# Patient Record
Sex: Male | Born: 1965 | Race: White | Hispanic: No | Marital: Married | Smoking: Never smoker
Health system: Southern US, Community
[De-identification: ages and names within clinical notes are randomized; demographics above are authoritative.]

## PROBLEM LIST (undated history)

## (undated) DIAGNOSIS — K5792 Diverticulitis of intestine, part unspecified, without perforation or abscess without bleeding: Secondary | ICD-10-CM

---

## 2003-06-02 ENCOUNTER — Inpatient Hospital Stay (HOSPITAL_COMMUNITY): Admission: EM | Admit: 2003-06-02 | Discharge: 2003-06-03 | Payer: Self-pay | Admitting: Emergency Medicine

## 2004-08-06 HISTORY — PX: COLON SURGERY: SHX602

## 2013-11-09 ENCOUNTER — Emergency Department (HOSPITAL_COMMUNITY)
Admission: EM | Admit: 2013-11-09 | Discharge: 2013-11-09 | Disposition: A | Discharge status: Home / Self Care | Payer: BC Managed Care – PPO | Attending: Emergency Medicine | Admitting: Emergency Medicine

## 2013-11-09 ENCOUNTER — Encounter (HOSPITAL_COMMUNITY): Payer: Self-pay | Admitting: Emergency Medicine

## 2013-11-09 ENCOUNTER — Emergency Department (HOSPITAL_COMMUNITY): Payer: BC Managed Care – PPO

## 2013-11-09 DIAGNOSIS — Y9389 Activity, other specified: Secondary | ICD-10-CM | POA: Insufficient documentation

## 2013-11-09 DIAGNOSIS — Z8719 Personal history of other diseases of the digestive system: Secondary | ICD-10-CM | POA: Insufficient documentation

## 2013-11-09 DIAGNOSIS — S52613A Displaced fracture of unspecified ulna styloid process, initial encounter for closed fracture: Secondary | ICD-10-CM

## 2013-11-09 DIAGNOSIS — S52509A Unspecified fracture of the lower end of unspecified radius, initial encounter for closed fracture: Secondary | ICD-10-CM | POA: Insufficient documentation

## 2013-11-09 DIAGNOSIS — S52609A Unspecified fracture of lower end of unspecified ulna, initial encounter for closed fracture: Principal | ICD-10-CM

## 2013-11-09 DIAGNOSIS — S52502A Unspecified fracture of the lower end of left radius, initial encounter for closed fracture: Secondary | ICD-10-CM

## 2013-11-09 DIAGNOSIS — Y92410 Unspecified street and highway as the place of occurrence of the external cause: Secondary | ICD-10-CM | POA: Insufficient documentation

## 2013-11-09 HISTORY — DX: Diverticulitis of intestine, part unspecified, without perforation or abscess without bleeding: K57.92

## 2013-11-09 MED ORDER — IBUPROFEN 400 MG PO TABS
800.0000 mg | ORAL_TABLET | Freq: Once | ORAL | Status: AC
  Administered 2013-11-09: 800 mg via ORAL
  Filled 2013-11-09: qty 2

## 2013-11-09 MED ORDER — HYDROCODONE-ACETAMINOPHEN 5-325 MG PO TABS: 1.0000 | ORAL_TABLET | ORAL | Status: DC | PRN

## 2013-11-09 NOTE — Progress Notes (Signed)
Orthopedic Tech Progress Note Patient Details:  Consepcion HearingDavid Glance [birth year] [id number] Sugartong splint applied to Left UE. Arm sling applied. Application tolerated well.  Ortho Devices Type of Ortho Device: Ace wrap;Arm sling;Sugartong splint Ortho Device/Splint Location: Left  Ortho Device/Splint Interventions: Application   Asia R Thompson 11/09/2013, 10:25 AM

## 2013-11-09 NOTE — ED Provider Notes (Signed)
Medical screening examination/treatment/procedure(s) were performed by non-physician practitioner and as supervising physician I was immediately available for consultation/collaboration.   EKG Interpretation None        Idara Woodside M Tiara Maultsby, MD 11/09/13 1307 

## 2013-11-09 NOTE — ED Notes (Signed)
Unable to get ring off patient.  He tried soap and lubricant.

## 2013-11-09 NOTE — ED Notes (Signed)
Patient ring cut off by [place], EMT.   Patient tolerated well.

## 2013-11-09 NOTE — ED Notes (Signed)
Called ortho to come place splint.
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
## 2013-11-09 NOTE — Discharge Instructions (Signed)
Read the information below.  Use the prescribed medication as directed.  Please discuss all new medications with your pharmacist.  Do not take additional tylenol while taking the prescribed pain medication to avoid overdose.  You may return to the Emergency Department at any time for worsening condition or any new symptoms that concern you.  If you develop uncontrolled pain, weakness or numbness of the extremity, severe discoloration of the skin, or you are unable to move your fingers, return to the ER for a recheck.       Forearm Fracture Your caregiver has diagnosed you as having a broken bone (fracture) of the forearm. This is the part of your arm between the elbow and your wrist. Your forearm is made up of two bones. These are the radius and ulna. A fracture is a break in one or both bones. A cast or splint is used to protect and keep your injured bone from moving. The cast or splint will be on generally for about 5 to 6 weeks, with individual variations. HOME CARE INSTRUCTIONS   Keep the injured part elevated while sitting or lying down. Keeping the injury above the level of your heart (the center of the chest). This will decrease swelling and pain.  Apply ice to the injury for 15-20 minutes, 03-04 times per day while awake, for 2 days. Put the ice in a plastic bag and place a thin towel between the bag of ice and your cast or splint.  If you have a plaster or fiberglass cast:  Do not try to scratch the skin under the cast using sharp or pointed objects.  Check the skin around the cast every day. You may put lotion on any red or sore areas.  Keep your cast dry and clean.  If you have a plaster splint:  Wear the splint as directed.  You may loosen the elastic around the splint if your fingers become numb, tingle, or turn cold or blue.  Do not put pressure on any part of your cast or splint. It may break. Rest your cast only on a pillow the first 24 hours until it is fully  hardened.  Your cast or splint can be protected during bathing with a plastic bag. Do not lower the cast or splint into water.  Only take over-the-counter or prescription medicines for pain, discomfort, or fever as directed by your caregiver. SEEK IMMEDIATE MEDICAL CARE IF:   Your cast gets damaged or breaks.  You have more severe pain or swelling than you did before the cast.  Your skin or nails below the injury turn blue or gray, or feel cold or numb.  There is a bad smell or new stains and/or pus like (purulent) drainage coming from under the cast. MAKE SURE YOU:   Understand these instructions.  Will watch your condition.  Will get help right away if you are not doing well or get worse. Document Released: 07/20/2000 Document Revised: 10/15/2011 Document Reviewed: 03/11/2008 [facility] Patient Information 2014 [place], [place].  Cast or Splint Care Casts and splints support injured limbs and keep bones from moving while they heal.  HOME CARE  Keep the cast or splint uncovered during the drying period.  A plaster cast can take 24 to 48 hours to dry.  A fiberglass cast will dry in less than 1 hour.  Do not rest the cast on anything harder than a pillow for 24 hours.  Do not put weight on your injured limb. Do not put  pressure on the cast. Wait for your doctor's approval.  Keep the cast or splint dry.  Cover the cast or splint with a plastic bag during baths or wet weather.  If you have a cast over your chest and belly (trunk), take sponge baths until the cast is taken off.  If your cast gets wet, dry it with a towel or blow dryer. Use the cool setting on the blow dryer.  Keep your cast or splint clean. Wash a dirty cast with a damp cloth.  Do not put any objects under your cast or splint.  Do not scratch the skin under the cast with an object. If itching is a problem, use a blow dryer on a cool setting over the itchy area.  Do not trim or cut your cast.  Do not  take out the padding from inside your cast.  Exercise your joints near the cast as told by your doctor.  Raise (elevate) your injured limb on 1 or 2 pillows for the first 1 to 3 days. GET HELP IF:  Your cast or splint cracks.  Your cast or splint is too tight or too loose.  You itch badly under the cast.  Your cast gets wet or has a soft spot.  You have a bad smell coming from the cast.  You get an object stuck under the cast.  Your skin around the cast becomes red or sore.  You have new or more pain after the cast is put on. GET HELP RIGHT AWAY IF:  You have fluid leaking through the cast.  You cannot move your fingers or toes.  Your fingers or toes turn blue or white or are cool, painful, or puffy (swollen).  You have tingling or lose feeling (numbness) around the injured area.  You have bad pain or pressure under the cast.  You have trouble breathing or have shortness of breath.  You have chest pain. Document Released: 11/22/2010 Document Revised: 03/25/2013 Document Reviewed: 01/29/2013 [facility] Patient Information 2014 [place], [place].

## 2013-11-09 NOTE — ED Notes (Signed)
Patient has strong smell of alcohol.
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
## 2015-03-21 ENCOUNTER — Ambulatory Visit (INDEPENDENT_AMBULATORY_CARE_PROVIDER_SITE_OTHER): Payer: BLUE CROSS/BLUE SHIELD | Admitting: Primary Care

## 2015-03-21 ENCOUNTER — Encounter: Payer: Self-pay | Admitting: Primary Care

## 2015-03-21 VITALS — BP 142/82 | HR 76 | Temp 98.2°F | Ht 73.0 in | Wt 218.1 lb

## 2015-03-21 DIAGNOSIS — R030 Elevated blood-pressure reading, without diagnosis of hypertension: Secondary | ICD-10-CM

## 2015-03-21 DIAGNOSIS — R7309 Other abnormal glucose: Secondary | ICD-10-CM | POA: Diagnosis not present

## 2015-03-21 DIAGNOSIS — R7303 Prediabetes: Secondary | ICD-10-CM

## 2015-03-21 DIAGNOSIS — G4700 Insomnia, unspecified: Secondary | ICD-10-CM | POA: Diagnosis not present

## 2015-03-21 DIAGNOSIS — E119 Type 2 diabetes mellitus without complications: Secondary | ICD-10-CM | POA: Insufficient documentation

## 2015-03-21 HISTORY — DX: Elevated blood-pressure reading, without diagnosis of hypertension: R03.0

## 2015-03-21 LAB — HEMOGLOBIN A1C: Hgb A1c MFr Bld: 6.4 % (ref 4.6–6.5)

## 2015-03-21 MED ORDER — TRAZODONE HCL 50 MG PO TABS: 25.0000 mg | ORAL_TABLET | Freq: Every evening | ORAL | Status: DC | PRN

## 2015-03-21 NOTE — Assessment & Plan Note (Signed)
Present for several years. Difficulty falling asleep and will wake up every night. Denies snoring, gasping for air in the night. RX for trazodone 25-50 mg at bedtime. Will continue to monitor.

## 2015-03-21 NOTE — Assessment & Plan Note (Addendum)
Denies prior elevated reading. Slightly elevated today, will continue to monitor.  Denies symptoms of headaches, chest pain, SOB.

## 2015-03-21 NOTE — Patient Instructions (Signed)
Start Trazodone tablet for difficulty sleeping. You may take 1/2 to 1 tablet by mouth 1 hour prior to bedtime for sleep.  Complete lab work prior to leaving today. I will notify you of your results.  Please schedule a physical with me in the next 3 months. You will also schedule a lab only appointment one week prior. We will discuss your lab results during your physical.  It was a pleasure to meet you today! Please don't hesitate to call me with any questions. Welcome to [facility]!

## 2015-03-21 NOTE — Assessment & Plan Note (Signed)
Endorses history of several years ago, no recent A1C. Will check today. Denies numbness/tingling. Overall poor diet.

## 2015-03-21 NOTE — Progress Notes (Signed)
Subjective:    Patient ID: Cody Oneill, male    DOB: 01-21-1966, 50 y.o.   MRN: [id number]  HPI  Cody Oneill is a 49 year old male who presents today to establish care and discuss the problems mentioned below. Will obtain old records.  1) Elevated blood pressure reading: Denies ever having elevated readings. Slightly elevated in the office today. Denies headaches, chest pain.  2) Borderline Diabetes: He was told that he was borderline diabetic during his last check up at [facility] several years ago. He endorses a fair diet which consists of:  Breakfast: Skips, fried egg, bacon, toast, orange juice, milk, coffee Lunch: Fast food (sandwich, hamburger) Dinner: Salad, steak, hamburgers, chicken, pork, vegetables. Desserts: Occasionally Beverages: Drinks mostly water, soda ( less than 2 per week)  Exercise: Does not currently exercise. Sometimes plays tennis.  3) Insomnia: Difficulty falling asleep every night. Will take 1-2 hours for him to fall asleep and will wake up in the middle of the night every night. He occasionally feels tired throughout the day. He will take Tylenol PM and will feel groggy throughout the following day. He's also tried Melatonin without help. Denies snoring, waking up gasping for air.   Review of Systems  Constitutional: Negative for unexpected weight change.  HENT: Negative for rhinorrhea.   Respiratory: Negative for cough and shortness of breath.   Cardiovascular: Negative for chest pain.  Gastrointestinal: Negative for diarrhea and constipation.  Genitourinary: Negative for difficulty urinating.  Musculoskeletal: Negative for myalgias and arthralgias.  Skin: Negative for rash.  Neurological: Negative for dizziness, numbness and headaches.  Psychiatric/Behavioral:       Some anxiety, able to manage himself.       Past Medical History  Diagnosis Date  . Diverticulitis     Social History   Social History  . Marital Status: Married    Spouse Name:  N/A  . Number of Children: N/A  . Years of Education: N/A   Occupational History  . Not on file.   Social History Main Topics  . Smoking status: Never Smoker   . Smokeless tobacco: Not on file  . Alcohol Use: 0.0 oz/week    0 Cans of beer per week  . Drug Use: No  . Sexual Activity: Not on file   Other Topics Concern  . Not on file   Social History Narrative   Married.   3 children.   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   Enjoys playing tennis, going to the lake, snow skiing.     Past Surgical History  Procedure Laterality Date  . Colon surgery  2006    History reviewed. No pertinent family history.  Allergies  Allergen Reactions  . Wasp Venom     No current outpatient prescriptions on file prior to visit.   No current facility-administered medications on file prior to visit.    BP 142/82 mmHg  Pulse 76  Temp(Src) 98.2 F (36.8 C) (Oral)  Ht  (1.854 m)  Wt 218 lb 1.9 oz (98.939 kg)  BMI 28.78 kg/m2  SpO2 97%    Objective:   Physical Exam  Constitutional: He appears well-nourished.  HENT:  Head: Normocephalic.  Cardiovascular: Normal rate and regular rhythm.   Pulmonary/Chest: Effort normal and breath sounds normal.  Abdominal: Soft. Bowel sounds are normal.  Neurological: He is alert.  Skin: Skin is warm and dry.  Psychiatric: He has a normal mood and affect.  Assessment & Plan:

## 2015-03-21 NOTE — Progress Notes (Signed)
Pre visit review using our clinic review tool, if applicable. No additional management support is needed unless otherwise documented below in the visit note. 

## 2015-03-23 ENCOUNTER — Encounter: Payer: Self-pay | Admitting: *Deleted

## 2015-04-04 ENCOUNTER — Encounter: Payer: Self-pay | Admitting: Family Medicine

## 2015-04-04 ENCOUNTER — Ambulatory Visit (INDEPENDENT_AMBULATORY_CARE_PROVIDER_SITE_OTHER): Payer: BLUE CROSS/BLUE SHIELD | Admitting: Family Medicine

## 2015-04-04 ENCOUNTER — Other Ambulatory Visit: Payer: Self-pay | Admitting: Primary Care

## 2015-04-04 VITALS — BP 110/70 | HR 78 | Temp 98.7°F | Wt 221.0 lb

## 2015-04-04 DIAGNOSIS — Z23 Encounter for immunization: Secondary | ICD-10-CM

## 2015-04-04 DIAGNOSIS — S61411A Laceration without foreign body of right hand, initial encounter: Secondary | ICD-10-CM

## 2015-04-04 DIAGNOSIS — Z0000 Encounter for general adult medical examination without abnormal findings: Secondary | ICD-10-CM

## 2015-04-04 NOTE — Patient Instructions (Signed)
If any spreading redness or pus draining, then update [place].  Keep it clean and covered in the meantime.  Trim the end of the steristrips at needed.  Take care.  Glad to see you.  Call back as needed.   Keep your schedule appointment.

## 2015-04-04 NOTE — Progress Notes (Signed)
Pre visit review using our clinic review tool, if applicable. No additional management support is needed unless otherwise documented below in the visit note.  Cut on R hand yesterday.  Was working on a tie down strap with a wench, it was under tension and broke and slapped across his hand.  This was >12 hours before the call into the clinic this AM.  He cleaned the area with soapy water and bandaged it.  For eval today.   Meds, vitals, and allergies reviewed.   ROS: See HPI.  Otherwise, noncontributory.  nad R hand with 3cm R thenar area superficial lac with some puffy subQ material protruding, but this is minimal.   Appears clean now o/w.  Not bleeding.   No deep lac noted on thorough exam.  No FB noted.  Wound soaked in saline/betadine.  Then irritated with saline.   Distally NV intact, normal hand/finger/wrist ROM.

## 2015-04-05 DIAGNOSIS — S61419A Laceration without foreign body of unspecified hand, initial encounter: Secondary | ICD-10-CM | POA: Insufficient documentation

## 2015-04-05 NOTE — Assessment & Plan Note (Signed)
We shouldn't suture given the duration and the exam.  Cleaned, no FB, doesn't appear to need proph abx by guidelines.  Tdap updated.  Tissue approximated with steristrips.  Tolerated well.  Covered with gauze and coban.  Recheck with PCP in a few days.  Cautions given re: signs of infection.  Okay for outpatient f/u.   >25 minutes spent in face to face time with patient with wound eval/treatment.

## 2015-04-07 ENCOUNTER — Other Ambulatory Visit (INDEPENDENT_AMBULATORY_CARE_PROVIDER_SITE_OTHER): Payer: BLUE CROSS/BLUE SHIELD

## 2015-04-07 DIAGNOSIS — Z0000 Encounter for general adult medical examination without abnormal findings: Secondary | ICD-10-CM

## 2015-04-07 DIAGNOSIS — R7989 Other specified abnormal findings of blood chemistry: Secondary | ICD-10-CM | POA: Diagnosis not present

## 2015-04-07 LAB — CBC
HCT: 48.4 % (ref 39.0–52.0)
HEMOGLOBIN: 16.3 g/dL (ref 13.0–17.0)
MCHC: 33.7 g/dL (ref 30.0–36.0)
MCV: 91.9 fl (ref 78.0–100.0)
PLATELETS: 376 10*3/uL (ref 150.0–400.0)
RBC: 5.26 Mil/uL (ref 4.22–5.81)
RDW: 13.5 % (ref 11.5–15.5)
WBC: 6.9 10*3/uL (ref 4.0–10.5)

## 2015-04-07 LAB — COMPREHENSIVE METABOLIC PANEL
ALK PHOS: 66 U/L (ref 39–117)
ALT: 47 U/L (ref 0–53)
AST: 27 U/L (ref 0–37)
Albumin: 4.1 g/dL (ref 3.5–5.2)
BILIRUBIN TOTAL: 0.3 mg/dL (ref 0.2–1.2)
BUN: 11 mg/dL (ref 6–23)
CO2: 25 meq/L (ref 19–32)
Calcium: 9.3 mg/dL (ref 8.4–10.5)
Chloride: 103 mEq/L (ref 96–112)
Creatinine, Ser: 1.07 mg/dL (ref 0.40–1.50)
GFR: 77.9 mL/min (ref >60.00)
Glucose, Bld: 137 mg/dL — ABNORMAL HIGH (ref 70–99)
Potassium: 4.7 mEq/L (ref 3.5–5.1)
SODIUM: 137 meq/L (ref 135–145)
TOTAL PROTEIN: 6.8 g/dL (ref 6.0–8.3)

## 2015-04-07 LAB — LIPID PANEL
CHOL/HDL RATIO: 5
Cholesterol: 205 mg/dL — ABNORMAL HIGH (ref 0–200)
HDL: 41.5 mg/dL (ref >39.00)
NONHDL: 163.23
TRIGLYCERIDES: 296 mg/dL — AB (ref 0.0–149.0)
VLDL: 59.2 mg/dL — ABNORMAL HIGH (ref 0.0–40.0)

## 2015-04-07 LAB — LDL CHOLESTEROL, DIRECT: Direct LDL: 124 mg/dL

## 2015-04-13 ENCOUNTER — Encounter: Payer: BLUE CROSS/BLUE SHIELD | Admitting: Primary Care

## 2015-04-28 ENCOUNTER — Encounter: Payer: Self-pay | Admitting: Primary Care

## 2015-04-28 ENCOUNTER — Ambulatory Visit (INDEPENDENT_AMBULATORY_CARE_PROVIDER_SITE_OTHER): Payer: BLUE CROSS/BLUE SHIELD | Admitting: Primary Care

## 2015-04-28 VITALS — BP 138/82 | HR 83 | Temp 98.5°F | Ht 73.0 in | Wt 223.1 lb

## 2015-04-28 DIAGNOSIS — Z23 Encounter for immunization: Secondary | ICD-10-CM

## 2015-04-28 DIAGNOSIS — R7309 Other abnormal glucose: Secondary | ICD-10-CM

## 2015-04-28 DIAGNOSIS — Z0000 Encounter for general adult medical examination without abnormal findings: Secondary | ICD-10-CM | POA: Diagnosis not present

## 2015-04-28 DIAGNOSIS — S61411A Laceration without foreign body of right hand, initial encounter: Secondary | ICD-10-CM

## 2015-04-28 DIAGNOSIS — R030 Elevated blood-pressure reading, without diagnosis of hypertension: Secondary | ICD-10-CM | POA: Diagnosis not present

## 2015-04-28 DIAGNOSIS — R7303 Prediabetes: Secondary | ICD-10-CM

## 2015-04-28 DIAGNOSIS — E7800 Pure hypercholesterolemia, unspecified: Secondary | ICD-10-CM | POA: Insufficient documentation

## 2015-04-28 DIAGNOSIS — G4700 Insomnia, unspecified: Secondary | ICD-10-CM

## 2015-04-28 MED ORDER — TRAZODONE HCL 100 MG PO TABS: 100.0000 mg | ORAL_TABLET | Freq: Every day | ORAL | Status: DC

## 2015-04-28 NOTE — Assessment & Plan Note (Signed)
No overall improvement with 50 mg tablets, will increase to 100 mg. Follow up in November.

## 2015-04-28 NOTE — Assessment & Plan Note (Signed)
Tetanus UTD, will provide flu today. Discussed the importance of healthy diet and regular exercise. Recommended annual eye exams. Exam unremarkable. Trigs and cholesterol elevated, discussed treatment. Follow up in 1 year for repeat physical.

## 2015-04-28 NOTE — Assessment & Plan Note (Signed)
TC, Trigs, and VLDL above goal. Discussed the importance of healthy diet and exercise. Start Fish Oil 2000 mg daily. Will recheck in 6 months.

## 2015-04-28 NOTE — Progress Notes (Signed)
Subjective:    Patient ID: Cody Oneill, male    DOB: November 19, 1965, 49 y.o.   MRN: [id number]  HPI  Cody Oneill is a 49 year old male who presents today for complete physical.  Immunizations: -Tetanus: Completed in August 2016 -Influenza: Has not completed this season, due today.   Diet: Endorses a fair diet. Breakfast: Skips, sometimes toast and coffee Lunch: Sandwiches ([place] subs, and home made sandwiches), soup. Dinner: Home cooked meals with lean meats, vegetables. Desserts: Rare Snacks: None Beverages: Dr. Reino Kent ( 2 12 ounce cans), water ( 3-10 glasses per day), beer on the weekends. Exercise: He purchased a fit bit and joined the country club. He's playing tennis and is walking. He is exercising daily for 30 minutes. Eye exam: Completed years ago. No changes in vision. Dental exam: Completes semi-annually    Review of Systems  Constitutional: Negative for unexpected weight change.  HENT: Negative for rhinorrhea.   Respiratory: Negative for cough and shortness of breath.   Cardiovascular: Negative for chest pain.  Gastrointestinal: Negative for diarrhea and constipation.  Genitourinary: Negative for difficulty urinating.  Musculoskeletal: Negative for myalgias and arthralgias.  Skin: Negative for rash.  Allergic/Immunologic: Negative for environmental allergies.  Neurological: Negative for dizziness, numbness and headaches.  Psychiatric/Behavioral:       Denies concerns for anxiety or depression.       Past Medical History  Diagnosis Date  . Diverticulitis     Social History   Social History  . Marital Status: Married    Spouse Name: N/A  . Number of Children: N/A  . Years of Education: N/A   Occupational History  . Not on file.   Social History Main Topics  . Smoking status: Never Smoker   . Smokeless tobacco: Not on file  . Alcohol Use: 0.0 oz/week    0 Cans of beer per week     Comment: weekends  . Drug Use: No  . Sexual Activity: Not on  file   Other Topics Concern  . Not on file   Social History Narrative   Married.   3 children.   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   Enjoys playing tennis, going to the lake, snow skiing.     Past Surgical History  Procedure Laterality Date  . Colon surgery  2006    No family history on file.  Allergies  Allergen Reactions  . Wasp Venom     No current outpatient prescriptions on file prior to visit.   No current facility-administered medications on file prior to visit.    BP 138/82 mmHg  Pulse 83  Temp(Src) 98.5 F (36.9 C) (Oral)  Ht  (1.854 m)  Wt 223 lb 1.9 oz (101.207 kg)  BMI 29.44 kg/m2  SpO2 97%    Objective:   Physical Exam  Constitutional: He is oriented to person, place, and time. He appears well-nourished.  HENT:  Right Ear: Tympanic membrane and ear canal normal.  Left Ear: Tympanic membrane and ear canal normal.  Nose: Nose normal.  Mouth/Throat: Oropharynx is clear and moist.  Eyes: Conjunctivae and EOM are normal. Pupils are equal, round, and reactive to light.  Neck: Neck supple.  Cardiovascular: Normal rate and regular rhythm.   Pulmonary/Chest: Effort normal and breath sounds normal.  Abdominal: Soft. Bowel sounds are normal. There is no tenderness.  Musculoskeletal: Normal range of motion.  Lymphadenopathy:    He has no cervical adenopathy.  Neurological: He is alert and oriented  to person, place, and time. No cranial nerve deficit.  Patellar DTR's decreased. History of chronic knee injuries.  Skin: Skin is warm and dry.  Psychiatric: He has a normal mood and affect.          Assessment & Plan:

## 2015-04-28 NOTE — Assessment & Plan Note (Signed)
Stable today.

## 2015-04-28 NOTE — Assessment & Plan Note (Signed)
Working to improve his diet and is aware of limitations. Discussed to decrease sodas, fast food, pastas, breads, rice. Increase lean meat and vegetables. Discussed the importance of daily exercise. Will recheck A1C in mid November.

## 2015-04-28 NOTE — Patient Instructions (Addendum)
It is important that you improve your diet. Please limit carbohydrates in the form of white bread, rice, pasta, cakes, cookies, sugary drinks, etc. Increase your consumption of fresh fruits and vegetables.   Increase water consumption. You need at least 2 liters daily.  You should be getting 1 hour of moderate intensity exercise 5 days weekly.  Start taking fish oil for elevated triglycerides. You need 2000 mg daily.  Follow up after November 15th for re-evaluation of borderline diabetes.   It was a pleasure to see you today!

## 2015-04-28 NOTE — Assessment & Plan Note (Signed)
Much improved, healing well.  Edges approximated and scabbing present to anterior surface. No s/s of infection.

## 2015-04-28 NOTE — Addendum Note (Signed)
Addended by: Tawnya Crook on: 04/28/2015 10:30 AM   Modules accepted: Orders

## 2015-04-28 NOTE — Progress Notes (Signed)
Pre visit review using our clinic review tool, if applicable. No additional management support is needed unless otherwise documented below in the visit note. 

## 2015-06-13 ENCOUNTER — Ambulatory Visit (INDEPENDENT_AMBULATORY_CARE_PROVIDER_SITE_OTHER): Payer: BLUE CROSS/BLUE SHIELD | Admitting: Primary Care

## 2015-06-13 ENCOUNTER — Other Ambulatory Visit: Payer: Self-pay | Admitting: Primary Care

## 2015-06-13 ENCOUNTER — Encounter: Payer: Self-pay | Admitting: Primary Care

## 2015-06-13 VITALS — BP 144/86 | HR 83 | Temp 96.8°F | Ht 73.0 in | Wt 217.8 lb

## 2015-06-13 DIAGNOSIS — R030 Elevated blood-pressure reading, without diagnosis of hypertension: Secondary | ICD-10-CM

## 2015-06-13 DIAGNOSIS — G4700 Insomnia, unspecified: Secondary | ICD-10-CM

## 2015-06-13 DIAGNOSIS — R7303 Prediabetes: Secondary | ICD-10-CM | POA: Diagnosis not present

## 2015-06-13 DIAGNOSIS — R591 Generalized enlarged lymph nodes: Secondary | ICD-10-CM | POA: Diagnosis not present

## 2015-06-13 DIAGNOSIS — Z113 Encounter for screening for infections with a predominantly sexual mode of transmission: Secondary | ICD-10-CM | POA: Diagnosis not present

## 2015-06-13 DIAGNOSIS — R599 Enlarged lymph nodes, unspecified: Secondary | ICD-10-CM

## 2015-06-13 LAB — POCT RAPID STREP A (OFFICE): Rapid Strep A Screen: NEGATIVE

## 2015-06-13 MED ORDER — AMOXICILLIN-POT CLAVULANATE 875-125 MG PO TABS: 1.0000 | ORAL_TABLET | Freq: Two times a day (BID) | ORAL | Status: DC

## 2015-06-13 MED ORDER — ZOLPIDEM TARTRATE 5 MG PO TABS: 5.0000 mg | ORAL_TABLET | Freq: Every evening | ORAL | Status: DC | PRN

## 2015-06-13 NOTE — Patient Instructions (Addendum)
Start Ambien for insomnia. Take 1 tablet by mouth 1 hour prior to bedtime.  Start Augmentin antibiotics for swollen glands. Take 1 tablet by mouth twice daily for 10 days.  Complete lab work prior to leaving today. I will notify you of your results.  Schedule a lab only appointment after November 15th for diabetes check.  Follow up in 6 weeks for re-evaluation of insomnia.  It was a pleasure to see you today!

## 2015-06-13 NOTE — Assessment & Plan Note (Signed)
Elevated today; however, acutely ill. Stable at plasma donation center. Will continue to monitor.

## 2015-06-13 NOTE — Progress Notes (Signed)
Subjective:    Patient ID: [name], male    DOB: March 30, 1966, 49 y.o.   MRN: [id number]  HPI  [name] is a 49 year old male who presents today with multiple complaints.  1) Adenopathy: Located to bilateral mandibular region, left sided larger than right. He first noticed his swelling on Saturday this past weekend. He also reports symptoms of chills, sore throat, joint aches. He's taken Aspirin this morning without relief. Denies cough, nasal congestion. His adenopathy became worse last night.   2) Insomnia: Currently managed on Trazodone 100 mg that was increased in late September 2016. Since his last visit he's not noticed any difference or improvement in sleep. He has difficulty falling asleep but typically will stay asleep. He's not slept in 2 days. Denies snoring.   3) Elevated Blood Pressure Reading: Elevated in August 2016 and again today in the clinic. He's had his BP checked at a plasma station which has been 130/70's.   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 He denies sores, penile discharge, penile itching, and is in a monogamous relationship.  Review of Systems  Constitutional: Positive for chills. Negative for fever.  HENT: Positive for sore throat. Negative for congestion, ear pain and sinus pressure.   Respiratory: Negative for cough and shortness of breath.   Cardiovascular: Negative for chest pain.  Genitourinary: Negative for dysuria, discharge, penile swelling, scrotal swelling, penile pain and testicular pain.  Musculoskeletal: Positive for myalgias.  Neurological: Negative for headaches.  Hematological: Positive for adenopathy.       Past Medical History  Diagnosis Date  . Diverticulitis     Social History   Social History  . Marital Status: Married    Spouse Name: N/A  . Number of Children: N/A  . Years of Education: N/A   Occupational History  . Not on file.   Social History Main Topics  . Smoking  status: Never Smoker   . Smokeless tobacco: Not on file  . Alcohol Use: 0.0 oz/week    0 Cans of beer per week     Comment: weekends  . Drug Use: No  . Sexual Activity: Not on file   Other Topics Concern  . Not on file   Social History Narrative   Married.   3 children.   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   Enjoys playing tennis, going to the lake, snow skiing.     Past Surgical History  Procedure Laterality Date  . Colon surgery  2006    No family history on file.  Allergies  Allergen Reactions  . Wasp Venom     No current outpatient prescriptions on file prior to visit.   No current facility-administered medications on file prior to visit.    BP 144/86 mmHg  Pulse 83  Temp(Src) 96.8 F (36 C) (Oral)  Ht 6\' 1"  (1.854 m)  Wt 217 lb 12.8 oz (98.793 kg)  BMI 28.74 kg/m2  SpO2 98%    Objective:   Physical Exam  Constitutional: He appears well-nourished.  HENT:  Right Ear: Tympanic membrane and ear canal normal.  Left Ear: Tympanic membrane and ear canal normal.  Nose: Right sinus exhibits no maxillary sinus tenderness and no frontal sinus tenderness. Left sinus exhibits no maxillary sinus tenderness and no frontal sinus tenderness.  Mouth/Throat: Posterior oropharyngeal edema and posterior oropharyngeal erythema present. No oropharyngeal exudate.  Neck:  2.5 cm enlarged lymph noted to left mandibular region. 1 cm  enlarged lymph noted to right mandibular side.  Cardiovascular: Normal rate and regular rhythm.   Pulmonary/Chest: Effort normal and breath sounds normal.  Lymphadenopathy:    He has cervical adenopathy.  Skin: Skin is warm and dry.          Assessment & Plan:  Lymphadenopathy:  Located to bilateral mandibular region, left being larger than right. Tender. Immobile. Also with body aches, fatigue, sore throat, fevers. Rapid Strep: Negative. Due to symptoms, presentation will treat empirically with Augmentin 10 day course. He is to  follow up if no improvement once antibiotics are complete.  Syphillus:  Positive test at plasma donation center. Currently in monogamous relationship, no symptoms. Will recheck today and add on HIV and Hep C panel.

## 2015-06-13 NOTE — Assessment & Plan Note (Signed)
No improvement with increased dose of Trazodone. Will stop trazodone and switch to Ambien HS. He is to follow up in 6 weeks for re-evaluation.

## 2015-06-13 NOTE — Assessment & Plan Note (Signed)
Due for recheck in mid November, he is to schedule lab only appointment then.

## 2015-06-13 NOTE — Progress Notes (Signed)
Pre visit review using our clinic review tool, if applicable. No additional management support is needed unless otherwise documented below in the visit note. 

## 2015-06-14 ENCOUNTER — Encounter: Payer: Self-pay | Admitting: *Deleted

## 2015-06-14 ENCOUNTER — Telehealth: Payer: Self-pay | Admitting: Primary Care

## 2015-06-14 ENCOUNTER — Other Ambulatory Visit: Payer: Self-pay | Admitting: Primary Care

## 2015-06-14 ENCOUNTER — Encounter (INDEPENDENT_AMBULATORY_CARE_PROVIDER_SITE_OTHER): Payer: Self-pay

## 2015-06-14 DIAGNOSIS — R7303 Prediabetes: Secondary | ICD-10-CM

## 2015-06-14 LAB — HIV ANTIBODY (ROUTINE TESTING W REFLEX): HIV: NONREACTIVE

## 2015-06-14 LAB — HEPATITIS C ANTIBODY: HCV AB: NEGATIVE

## 2015-06-14 LAB — RPR

## 2015-06-14 NOTE — Telephone Encounter (Signed)
Called and spoken to patient. Patient wanted a copy of labs.Sending letter with results and Kate's comments for patient.

## 2015-06-14 NOTE — Telephone Encounter (Signed)
Pt calling regarding labs please call back at [phone] Thank you

## 2015-06-22 ENCOUNTER — Other Ambulatory Visit: Payer: BLUE CROSS/BLUE SHIELD

## 2015-06-23 ENCOUNTER — Other Ambulatory Visit: Payer: BLUE CROSS/BLUE SHIELD

## 2015-06-27 ENCOUNTER — Ambulatory Visit (INDEPENDENT_AMBULATORY_CARE_PROVIDER_SITE_OTHER): Payer: BLUE CROSS/BLUE SHIELD | Admitting: Primary Care

## 2015-06-27 ENCOUNTER — Telehealth: Payer: Self-pay | Admitting: Primary Care

## 2015-06-27 ENCOUNTER — Encounter: Payer: Self-pay | Admitting: Primary Care

## 2015-06-27 VITALS — BP 124/84 | HR 75 | Temp 97.7°F | Ht 73.0 in | Wt 215.8 lb

## 2015-06-27 DIAGNOSIS — E119 Type 2 diabetes mellitus without complications: Secondary | ICD-10-CM | POA: Diagnosis not present

## 2015-06-27 DIAGNOSIS — G4700 Insomnia, unspecified: Secondary | ICD-10-CM

## 2015-06-27 DIAGNOSIS — R7303 Prediabetes: Secondary | ICD-10-CM

## 2015-06-27 LAB — HEMOGLOBIN A1C: Hgb A1c MFr Bld: 6.5 % (ref 4.6–6.5)

## 2015-06-27 MED ORDER — METFORMIN HCL ER 500 MG PO TB24: 500.0000 mg | ORAL_TABLET | Freq: Every day | ORAL | Status: DC

## 2015-06-27 NOTE — Telephone Encounter (Signed)
Called and notified patient of Kate's comments. Patient verbalized understanding.  

## 2015-06-27 NOTE — Assessment & Plan Note (Signed)
Improved with initiation of Ambien. Now getting 6 hours of sleep on average. Overall feeling much better.

## 2015-06-27 NOTE — Patient Instructions (Addendum)
Complete lab work prior to leaving today. I will notify you of your results.  Schedule a lab only appointment in March 2017 for cholesterol check.  Cancel your upcoming appointment. I'll see you in September 2017 for repeat physical or sooner if needed.  It was a pleasure to see you today!

## 2015-06-27 NOTE — Progress Notes (Signed)
   Subjective:    Patient ID: [name], male    DOB: 07/31/1966, 49 y.o.   MRN: [id number]  HPI  Cody Oneill is a 49 year old male who presents today for follow up.  1) Borderline Diabetes:  A1C of 6.4 in August 2016. Due for repeat A1C today. He's been working to improve his diet.  Breakfast: Skips, toast with peanut butter, milk, coffee, occasionally eggs. Lunch: Soup and salad, fast food Dinner: Home cooked meals, grilled and baked lean meats, vegetables, fruit Desserts: Occasionally Snacks: None Beverages: Dr. Reino KentPepper (one daily), water (3 large glasses daily).  2) Insomnia: Initiated on Ambien in early November for insomnia as trazodone had no effect. Since initiation of Ambien he's experiencing improvement in sleep and is now getting 6 hours of sleep a night. He's not waking up in the middle of the night. Denies nightmares, sleep walking.   Review of Systems  Respiratory: Negative for shortness of breath.   Cardiovascular: Negative for chest pain.  Neurological: Negative for dizziness, numbness and headaches.  Psychiatric/Behavioral: Negative for suicidal ideas and sleep disturbance.       Past Medical History  Diagnosis Date  . Diverticulitis     Social History   Social History  . Marital Status: Married    Spouse Name: N/A  . Number of Children: N/A  . Years of Education: N/A   Occupational History  . Not on file.   Social History Main Topics  . Smoking status: Never Smoker   . Smokeless tobacco: Not on file  . Alcohol Use: 0.0 oz/week    0 Cans of beer per week     Comment: weekends  . Drug Use: No  . Sexual Activity: Not on file   Other Topics Concern  . Not on file   Social History Narrative   Married.   3 children.   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   Enjoys playing tennis, going to the lake, snow skiing.     Past Surgical History  Procedure Laterality Date  . Colon surgery  2006    No family history on file.  Allergies    Allergen Reactions  . Wasp Venom     Current Outpatient Prescriptions on File Prior to Visit  Medication Sig Dispense Refill  . zolpidem (AMBIEN) 5 MG tablet Take 1 tablet (5 mg total) by mouth at bedtime as needed for sleep. 30 tablet 1   No current facility-administered medications on file prior to visit.    BP 124/84 mmHg  Pulse 75  Temp(Src) 97.7 F (36.5 C) (Oral)  Ht 6\' 1"  (1.854 m)  Wt 215 lb 12.8 oz (97.886 kg)  BMI 28.48 kg/m2  SpO2 97%    Objective:   Physical Exam  Constitutional: He appears well-nourished.  Cardiovascular: Normal rate and regular rhythm.   Pulmonary/Chest: Effort normal and breath sounds normal.  Skin: Skin is warm and dry.  Psychiatric: He has a normal mood and affect.          Assessment & Plan:

## 2015-06-27 NOTE — Progress Notes (Signed)
Pre visit review using our clinic review tool, if applicable. No additional management support is needed unless otherwise documented below in the visit note. 

## 2015-06-27 NOTE — Assessment & Plan Note (Signed)
Repeat A1C today reveals A1C of 6.5 which is a diagnosis of DM type 2. Will start Metformin XR 500 mg daily with repeat A1C in 3 months.

## 2015-06-27 NOTE — Telephone Encounter (Signed)
Please notify [name] that he is now diabetic. We will need to start him on a low dose medication to help prevent his diabetes from becoming worse. I have sent in Metformin XR 500 mg tablets to his pharmacy. He is to take 1 tablet by mouth once every morning with a meal/snack. He will need to be seen for follow up in 3 months. Will you please schedule? Thanks.

## 2015-07-26 ENCOUNTER — Ambulatory Visit: Payer: BLUE CROSS/BLUE SHIELD | Admitting: Primary Care

## 2015-08-31 ENCOUNTER — Other Ambulatory Visit: Payer: Self-pay | Admitting: Primary Care

## 2015-08-31 DIAGNOSIS — G4700 Insomnia, unspecified: Secondary | ICD-10-CM

## 2015-08-31 MED ORDER — ZOLPIDEM TARTRATE 5 MG PO TABS: 5.0000 mg | ORAL_TABLET | Freq: Every evening | ORAL | Status: DC | PRN

## 2015-08-31 NOTE — Telephone Encounter (Signed)
Received fax refill request for  zolpidem (AMBIEN) 5 MG tablet     Take 1 tablet (5 mg total) by mouth at bedtime as needed for sleep.   Dispense:  30   Refill:  1  Last prescribed on 06/13/2015. Last seen 06/27/2015. Next lab appointment on 10/12/2015.

## 2015-08-31 NOTE — Telephone Encounter (Signed)
Called in Rx to [facility].

## 2015-10-12 ENCOUNTER — Other Ambulatory Visit: Payer: BLUE CROSS/BLUE SHIELD

## 2015-10-13 ENCOUNTER — Other Ambulatory Visit (INDEPENDENT_AMBULATORY_CARE_PROVIDER_SITE_OTHER): Payer: BLUE CROSS/BLUE SHIELD

## 2015-10-13 DIAGNOSIS — R7303 Prediabetes: Secondary | ICD-10-CM

## 2015-10-14 LAB — HEMOGLOBIN A1C: Hgb A1c MFr Bld: 6.2 % (ref 4.6–6.5)

## 2015-10-31 ENCOUNTER — Other Ambulatory Visit: Payer: Self-pay | Admitting: Primary Care

## 2016-03-22 ENCOUNTER — Other Ambulatory Visit: Payer: Self-pay | Admitting: Primary Care

## 2016-03-22 DIAGNOSIS — G4700 Insomnia, unspecified: Secondary | ICD-10-CM

## 2016-03-22 NOTE — Telephone Encounter (Signed)
Ok to refill? Electronically refill request for zolpidem (AMBIEN) 5 MG tablet.  Last prescribed on 08/31/2015. Last seen on 06/26/2016. Follow up on 04/16/2016.

## 2016-03-22 NOTE — Telephone Encounter (Signed)
Called in medication to the pharmacy as instructed. 

## 2016-04-16 ENCOUNTER — Encounter: Payer: Self-pay | Admitting: Primary Care

## 2016-04-16 ENCOUNTER — Ambulatory Visit (INDEPENDENT_AMBULATORY_CARE_PROVIDER_SITE_OTHER): Payer: BLUE CROSS/BLUE SHIELD | Admitting: Primary Care

## 2016-04-16 ENCOUNTER — Encounter: Payer: Self-pay | Admitting: Radiology

## 2016-04-16 VITALS — BP 142/86 | HR 65 | Temp 97.9°F | Ht 73.0 in | Wt 215.4 lb

## 2016-04-16 DIAGNOSIS — R030 Elevated blood-pressure reading, without diagnosis of hypertension: Secondary | ICD-10-CM

## 2016-04-16 DIAGNOSIS — G4700 Insomnia, unspecified: Secondary | ICD-10-CM

## 2016-04-16 DIAGNOSIS — E119 Type 2 diabetes mellitus without complications: Secondary | ICD-10-CM | POA: Diagnosis not present

## 2016-04-16 DIAGNOSIS — E785 Hyperlipidemia, unspecified: Secondary | ICD-10-CM

## 2016-04-16 DIAGNOSIS — Z23 Encounter for immunization: Secondary | ICD-10-CM

## 2016-04-16 DIAGNOSIS — Z1211 Encounter for screening for malignant neoplasm of colon: Secondary | ICD-10-CM

## 2016-04-16 DIAGNOSIS — Z79899 Other long term (current) drug therapy: Secondary | ICD-10-CM | POA: Diagnosis not present

## 2016-04-16 DIAGNOSIS — E7800 Pure hypercholesterolemia, unspecified: Secondary | ICD-10-CM

## 2016-04-16 LAB — MICROALBUMIN / CREATININE URINE RATIO
Creatinine,U: 429.5 mg/dL
MICROALB UR: 1.6 mg/dL (ref 0.0–1.9)
MICROALB/CREAT RATIO: 0.4 mg/g (ref 0.0–30.0)

## 2016-04-16 LAB — LIPID PANEL
CHOLESTEROL: 194 mg/dL (ref 0–200)
HDL: 40.3 mg/dL (ref >39.00)
LDL Cholesterol: 126 mg/dL — ABNORMAL HIGH (ref 0–99)
NONHDL: 153.23
Total CHOL/HDL Ratio: 5
Triglycerides: 135 mg/dL (ref 0.0–149.0)
VLDL: 27 mg/dL (ref 0.0–40.0)

## 2016-04-16 LAB — HEMOGLOBIN A1C: HEMOGLOBIN A1C: 6.5 % (ref 4.6–6.5)

## 2016-04-16 MED ORDER — ZOLPIDEM TARTRATE 5 MG PO TABS: 5.0000 mg | ORAL_TABLET | Freq: Every evening | ORAL | 1 refills | Status: DC | PRN

## 2016-04-16 NOTE — Assessment & Plan Note (Signed)
Above goal last visit, encouraged to work on diet and exercise. Due for recheck today. Discussed proper diet and regular exercise.

## 2016-04-16 NOTE — Patient Instructions (Addendum)
Complete lab work prior to leaving today. I will notify you of your results once received.   Continue to monitor your blood pressure. Please notify me if you get readings at or above 140/90 consistently.   You were provided with a pneumonia and flu vaccination today.   You must work on weight loss through healthy diet and routine exercise as discussed.  You will be contacted regarding your referral to GI for your colonoscopy.  Please let us know if you have not heard back within one week.   Please schedule a physical with me in 6 months.   It was a pleasure to see you today!

## 2016-04-16 NOTE — Progress Notes (Signed)
Pre visit review using our clinic review tool, if applicable. No additional management support is needed unless otherwise documented below in the visit note. 

## 2016-04-16 NOTE — Assessment & Plan Note (Signed)
Much improved on Ambien, refill provided today. UDS and controlled substance contract obtained today.

## 2016-04-16 NOTE — Assessment & Plan Note (Signed)
Slightly above goal in office today, improved on recheck. Discussed parameters for patient to monitor at home. Asymptomatic.

## 2016-04-16 NOTE — Assessment & Plan Note (Signed)
Improvement in A1C in March 2017, due for repeat A1C today. Urine microalbumin pending. Pneumovax and flu provided today. Foot exam completed today. Discussed proper diabetic diet. Continue Metformin XR.  Follow up in 6 months.

## 2016-04-16 NOTE — Addendum Note (Signed)
Addended by: Tawnya CrookSAMBATH, Klark Vanderhoef on: 04/16/2016 09:25 AM   Modules accepted: Orders
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
## 2016-04-23 ENCOUNTER — Other Ambulatory Visit: Payer: Self-pay | Admitting: Primary Care

## 2016-04-23 MED ORDER — METFORMIN HCL ER 500 MG PO TB24: 500.0000 mg | ORAL_TABLET | Freq: Every day | ORAL | 1 refills | Status: DC

## 2016-04-23 NOTE — Telephone Encounter (Signed)
Received faxed refill request for 90 days supply  metFORMIN (GLUCOPHAGE-XR) 500 MG 24 hr tablet  Last prescribed on 10/31/2015. Last seen on 04/16/2016.  Will refill as requested.

## 2016-05-10 ENCOUNTER — Telehealth: Payer: Self-pay | Admitting: Primary Care

## 2016-05-10 NOTE — Telephone Encounter (Signed)
Spoke to pt about colonoscopy referral. Pt declines at this time. I did mention to pt about ifob/cologuard. Pt would like more information about these tests. Please advise.

## 2016-05-10 NOTE — Telephone Encounter (Signed)
Please notify patient that Cologuard is a noninvasive screening for colon cancer. If you sign up they will send you a kit through the mail. You provide them with the stool specimen and they will pick the kit up from your front door. I will receive the results and if they are negative and we do not need to repeat this test for 3 years. Most insurance companies will cover this test. Please get patient signed up if he is interested.

## 2016-05-11 NOTE — Telephone Encounter (Signed)
Per DPR, left detail message of Kate's comments for patient. Noted that patient to call if he has questions.

## 2016-05-29 DIAGNOSIS — F4323 Adjustment disorder with mixed anxiety and depressed mood: Secondary | ICD-10-CM | POA: Diagnosis not present

## 2016-06-12 DIAGNOSIS — F4323 Adjustment disorder with mixed anxiety and depressed mood: Secondary | ICD-10-CM | POA: Diagnosis not present

## 2016-07-03 DIAGNOSIS — F4323 Adjustment disorder with mixed anxiety and depressed mood: Secondary | ICD-10-CM | POA: Diagnosis not present

## 2016-10-15 ENCOUNTER — Other Ambulatory Visit: Payer: Self-pay | Admitting: Primary Care

## 2016-10-15 DIAGNOSIS — G4700 Insomnia, unspecified: Secondary | ICD-10-CM

## 2016-10-15 NOTE — Telephone Encounter (Signed)
Ok to refill? Electronically refill request for zolpidem (AMBIEN) 5 MG tablet. Last prescribed and seen on 04/16/2016.

## 2016-10-16 NOTE — Telephone Encounter (Signed)
Called in medication to the pharmacy as instructed. 

## 2016-12-10 DIAGNOSIS — F4323 Adjustment disorder with mixed anxiety and depressed mood: Secondary | ICD-10-CM | POA: Diagnosis not present

## 2016-12-20 DIAGNOSIS — F4323 Adjustment disorder with mixed anxiety and depressed mood: Secondary | ICD-10-CM | POA: Diagnosis not present

## 2017-01-01 DIAGNOSIS — F4323 Adjustment disorder with mixed anxiety and depressed mood: Secondary | ICD-10-CM | POA: Diagnosis not present

## 2017-01-07 ENCOUNTER — Other Ambulatory Visit: Payer: Self-pay | Admitting: Primary Care

## 2017-02-12 DIAGNOSIS — F4323 Adjustment disorder with mixed anxiety and depressed mood: Secondary | ICD-10-CM | POA: Diagnosis not present

## 2017-02-27 ENCOUNTER — Encounter: Payer: Self-pay | Admitting: Family Medicine

## 2017-02-27 ENCOUNTER — Ambulatory Visit (INDEPENDENT_AMBULATORY_CARE_PROVIDER_SITE_OTHER): Payer: BLUE CROSS/BLUE SHIELD | Admitting: Family Medicine

## 2017-02-27 VITALS — BP 116/78 | HR 76 | Temp 98.4°F | Wt 206.5 lb

## 2017-02-27 DIAGNOSIS — E119 Type 2 diabetes mellitus without complications: Secondary | ICD-10-CM

## 2017-02-27 DIAGNOSIS — S0993XA Unspecified injury of face, initial encounter: Secondary | ICD-10-CM

## 2017-02-27 HISTORY — DX: Unspecified injury of face, initial encounter: S09.93XA
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
## 2017-02-27 NOTE — Patient Instructions (Addendum)
We will order CT of sinuses and to help evaluate for maxillary fracture. We will be in touch with results.  In the meantime continue alternating tylenol and ibuprofen.  See Shirlee LimerickMarion on your way out.
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
## 2017-02-28 ENCOUNTER — Telehealth: Payer: Self-pay | Admitting: Family Medicine

## 2017-02-28 ENCOUNTER — Ambulatory Visit (INDEPENDENT_AMBULATORY_CARE_PROVIDER_SITE_OTHER)
Admission: RE | Admit: 2017-02-28 | Discharge: 2017-02-28 | Disposition: A | Discharge status: Home / Self Care | Payer: BLUE CROSS/BLUE SHIELD | Source: Ambulatory Visit | Attending: Family Medicine | Admitting: Family Medicine

## 2017-02-28 ENCOUNTER — Encounter: Payer: Self-pay | Admitting: Family Medicine

## 2017-02-28 DIAGNOSIS — S02401A Maxillary fracture, unspecified, initial encounter for closed fracture: Secondary | ICD-10-CM | POA: Diagnosis not present

## 2017-02-28 DIAGNOSIS — S0993XA Unspecified injury of face, initial encounter: Secondary | ICD-10-CM | POA: Diagnosis not present

## 2017-02-28 NOTE — Telephone Encounter (Signed)
See result note.  

## 2017-02-28 NOTE — Telephone Encounter (Signed)
Patient called to get CT results. 

## 2017-03-01 MED ORDER — TRAMADOL HCL 50 MG PO TABS: 50.0000 mg | ORAL_TABLET | Freq: Two times a day (BID) | ORAL | 0 refills | Status: DC | PRN

## 2017-03-01 NOTE — Telephone Encounter (Signed)
plz phone in tramadol for patient.

## 2017-03-01 NOTE — Telephone Encounter (Signed)
Verbal refill given to Cody Oneill at the pharmacy 

## 2017-03-07 ENCOUNTER — Other Ambulatory Visit: Payer: Self-pay | Admitting: *Deleted

## 2017-03-07 DIAGNOSIS — G4700 Insomnia, unspecified: Secondary | ICD-10-CM

## 2017-03-07 NOTE — Telephone Encounter (Signed)
Patient has lost remaining medication, requesting a refill to be approved early. Faxed refill request. Zolpidem Last office visit:   04/16/16, acute OV 02/27/17 Last Filled:    90 tablet 1 10/15/2016  Please advise.

## 2017-03-07 NOTE — Telephone Encounter (Signed)
Phoned pharmacy and left detailed message on patient's VM.

## 2017-03-07 NOTE — Telephone Encounter (Signed)
This medication is a controlled substance and cannot be replaced. We can refill it in early-mid September when it is due to be refilled. Also needs a follow-up office visit with me in September for any additional refills of medications.

## 2017-03-12 ENCOUNTER — Encounter: Payer: Self-pay | Admitting: Family Medicine

## 2017-03-12 DIAGNOSIS — F4323 Adjustment disorder with mixed anxiety and depressed mood: Secondary | ICD-10-CM | POA: Diagnosis not present

## 2017-03-13 NOTE — Telephone Encounter (Signed)
Can you please help him with the CT scan copy?

## 2017-03-14 ENCOUNTER — Encounter: Payer: Self-pay | Admitting: *Deleted

## 2017-03-14 ENCOUNTER — Other Ambulatory Visit: Payer: Self-pay | Admitting: Primary Care

## 2017-03-14 DIAGNOSIS — E7800 Pure hypercholesterolemia, unspecified: Secondary | ICD-10-CM

## 2017-03-14 DIAGNOSIS — E119 Type 2 diabetes mellitus without complications: Secondary | ICD-10-CM

## 2017-03-25 ENCOUNTER — Other Ambulatory Visit (INDEPENDENT_AMBULATORY_CARE_PROVIDER_SITE_OTHER): Payer: BLUE CROSS/BLUE SHIELD

## 2017-03-25 DIAGNOSIS — E119 Type 2 diabetes mellitus without complications: Secondary | ICD-10-CM

## 2017-03-25 DIAGNOSIS — E7800 Pure hypercholesterolemia, unspecified: Secondary | ICD-10-CM | POA: Diagnosis not present

## 2017-03-25 LAB — COMPREHENSIVE METABOLIC PANEL
ALBUMIN: 4.1 g/dL (ref 3.5–5.2)
ALK PHOS: 76 U/L (ref 39–117)
ALT: 20 U/L (ref 0–53)
AST: 12 U/L (ref 0–37)
BUN: 11 mg/dL (ref 6–23)
CALCIUM: 9.4 mg/dL (ref 8.4–10.5)
CHLORIDE: 102 meq/L (ref 96–112)
CO2: 29 mEq/L (ref 19–32)
Creatinine, Ser: 0.91 mg/dL (ref 0.40–1.50)
GFR: 93.18 mL/min (ref >60.00)
Glucose, Bld: 133 mg/dL — ABNORMAL HIGH (ref 70–99)
POTASSIUM: 4.9 meq/L (ref 3.5–5.1)
SODIUM: 138 meq/L (ref 135–145)
TOTAL PROTEIN: 7.5 g/dL (ref 6.0–8.3)
Total Bilirubin: 0.3 mg/dL (ref 0.2–1.2)

## 2017-03-25 LAB — LIPID PANEL
CHOLESTEROL: 164 mg/dL (ref 0–200)
HDL: 39.6 mg/dL (ref >39.00)
LDL Cholesterol: 90 mg/dL (ref 0–99)
NonHDL: 123.9
TRIGLYCERIDES: 169 mg/dL — AB (ref 0.0–149.0)
Total CHOL/HDL Ratio: 4
VLDL: 33.8 mg/dL (ref 0.0–40.0)

## 2017-03-25 LAB — MICROALBUMIN / CREATININE URINE RATIO
Creatinine,U: 190.1 mg/dL
MICROALB UR: 0.9 mg/dL (ref 0.0–1.9)
Microalb Creat Ratio: 0.5 mg/g (ref 0.0–30.0)

## 2017-03-25 LAB — HEMOGLOBIN A1C: HEMOGLOBIN A1C: 6.2 % (ref 4.6–6.5)

## 2017-03-27 ENCOUNTER — Encounter: Payer: Self-pay | Admitting: Primary Care

## 2017-03-27 ENCOUNTER — Ambulatory Visit (INDEPENDENT_AMBULATORY_CARE_PROVIDER_SITE_OTHER): Payer: BLUE CROSS/BLUE SHIELD | Admitting: Primary Care

## 2017-03-27 VITALS — BP 104/62 | HR 67 | Temp 98.2°F | Ht 73.0 in | Wt 204.8 lb

## 2017-03-27 DIAGNOSIS — E119 Type 2 diabetes mellitus without complications: Secondary | ICD-10-CM | POA: Diagnosis not present

## 2017-03-27 DIAGNOSIS — S0993XA Unspecified injury of face, initial encounter: Secondary | ICD-10-CM

## 2017-03-27 DIAGNOSIS — R030 Elevated blood-pressure reading, without diagnosis of hypertension: Secondary | ICD-10-CM

## 2017-03-27 DIAGNOSIS — Z0000 Encounter for general adult medical examination without abnormal findings: Secondary | ICD-10-CM | POA: Diagnosis not present

## 2017-03-27 DIAGNOSIS — Z1211 Encounter for screening for malignant neoplasm of colon: Secondary | ICD-10-CM

## 2017-03-27 DIAGNOSIS — G4700 Insomnia, unspecified: Secondary | ICD-10-CM

## 2017-03-27 DIAGNOSIS — E7800 Pure hypercholesterolemia, unspecified: Secondary | ICD-10-CM | POA: Diagnosis not present

## 2017-03-27 NOTE — Progress Notes (Signed)
Subjective:    Patient ID: Cody Oneill, male    DOB: 1965/11/11, 51 y.o.   MRN: [id number]  HPI  Cody Oneill is a 51 year old male who presents today for complete physical.  Immunizations: -Tetanus: Completed in 2016 -Influenza: Due this season -Pneumonia: Completed in 2017  Diet: He endorses a healthy diet. Breakfast: Skips Lunch: Fast food Dinner: Chicken, beef, pork, salad, beans Snacks: None Desserts: Ice cream, chocolate; 2-3 times weekly Beverages: Water, occasional soda  Exercise: He walk and plays tennis.  Eye exam: Has not completed recently, due. Dental exam: Completes twice annually Colonoscopy: Due.   The 10-year ASCVD risk score Cody Oneill DC Montez Hageman., et al., 2013) is: 4.9%   Values used to calculate the score:     Age: 27 years     Sex: Male     Is Non-Hispanic African American: No     Diabetic: Yes     Tobacco smoker: No     Systolic Blood Pressure: 104 mmHg     Is BP treated: No     HDL Cholesterol: 39.6 mg/dL     Total Cholesterol: 164 mg/dL     Review of Systems  Constitutional: Negative for unexpected weight change.  HENT: Negative for rhinorrhea.   Respiratory: Negative for cough and shortness of breath.   Cardiovascular: Negative for chest pain.  Gastrointestinal: Negative for constipation and diarrhea.  Genitourinary: Negative for difficulty urinating.  Musculoskeletal: Negative for arthralgias and myalgias.  Skin: Negative for rash.  Allergic/Immunologic: Negative for environmental allergies.  Neurological: Negative for dizziness, numbness and headaches.  Psychiatric/Behavioral:       Denies concerns for anxiety and depression       Past Medical History:  Diagnosis Date  . Diverticulitis      Social History   Social History  . Marital status: Married    Spouse name: N/A  . Number of children: N/A  . Years of education: N/A   Occupational History  . Not on file.   Social History Main Topics  . Smoking status: Never Smoker    . Smokeless tobacco: Never Used  . Alcohol use No  . Drug use: No  . Sexual activity: Not on file   Other Topics Concern  . Not on file   Social History Narrative   Married.   3 children.   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   Enjoys playing tennis, going to the lake, snow skiing.     Past Surgical History:  Procedure Laterality Date  . COLON SURGERY  2006    No family history on file.  Allergies  Allergen Reactions  . Wasp Venom     Current Outpatient Prescriptions on File Prior to Visit  Medication Sig Dispense Refill  . metFORMIN (GLUCOPHAGE-XR) 500 MG 24 hr tablet Take 1 tablet (500 mg total) by mouth daily with breakfast. NEED OFFICE VISIT FOR ANY ADDITIONAL REFILLS 90 tablet 0  . traMADol (ULTRAM) 50 MG tablet Take 1 tablet (50 mg total) by mouth 2 (two) times daily as needed for moderate pain. 30 tablet 0  . zolpidem (AMBIEN) 5 MG tablet TAKE 1 TABLET BY MOUTH AT BEDTIME AS NEEDED FOR SLEEP 90 tablet 1   No current facility-administered medications on file prior to visit.     BP 104/62 (BP Location: Right Arm, Patient Position: Sitting, Cuff Size: Normal)   Pulse 67   Temp 98.2 F (36.8 C) (Oral)   Ht 6\' 1"  (1.854 m)  Wt 204 lb 12 oz (92.9 kg)   SpO2 94%   BMI 27.01 kg/m    Objective:   Physical Exam  Constitutional: He is oriented to person, place, and time. He appears well-nourished.  HENT:  Right Ear: Tympanic membrane and ear canal normal.  Left Ear: Tympanic membrane and ear canal normal.  Nose: Nose normal. Right sinus exhibits no maxillary sinus tenderness and no frontal sinus tenderness. Left sinus exhibits no maxillary sinus tenderness and no frontal sinus tenderness.  Mouth/Throat: Oropharynx is clear and moist.  Eyes: Pupils are equal, round, and reactive to light. Conjunctivae and EOM are normal.  Neck: Neck supple. Carotid bruit is not present. No thyromegaly present.  Cardiovascular: Normal rate, regular rhythm and normal heart  sounds.   Pulmonary/Chest: Effort normal and breath sounds normal. He has no wheezes. He has no rales.  Abdominal: Soft. Bowel sounds are normal. There is no tenderness.  Musculoskeletal: Normal range of motion.  Neurological: He is alert and oriented to person, place, and time. He has normal reflexes. No cranial nerve deficit.  Skin: Skin is warm and dry.  Psychiatric: He has a normal mood and affect.          Assessment & Plan:

## 2017-03-27 NOTE — Assessment & Plan Note (Signed)
Lost his recent bottle of Ambien, discussed that this cannot be replaced. He is due for refill and UDS in September. Will defer on UDS today as he's not had Ambien in several weeks.

## 2017-03-27 NOTE — Assessment & Plan Note (Signed)
Immunizations UTD. Declines colonoscopy, opts for Cologuard. Signed up today. Discussed the importance of a healthy diet and regular exercise in order for weight loss, and to reduce the risk of other medical problems. Exam unremarkable. Labs stable. Follow up in 1 year.

## 2017-03-27 NOTE — Assessment & Plan Note (Signed)
Recent A1C of 6.2, continue Metformin 500 mg once daily. Foot exam unremarkable. Pneumonia vaccination UTD. Not on statin or ACE. Urine microalbumin pending. He will schedule an eye exam. Follow up in 6 months for re-evaluation.  The 10-year ASCVD risk score [name] DC Montez Hageman., et al., 2013) is: 4.9%   Values used to calculate the score:     Age: 51 years     Sex: Male     Is Non-Hispanic African American: No     Diabetic: Yes     Tobacco smoker: No     Systolic Blood Pressure: 104 mmHg     Is BP treated: No     HDL Cholesterol: 39.6 mg/dL     Total Cholesterol: 164 mg/dL

## 2017-03-27 NOTE — Assessment & Plan Note (Signed)
Maxillofacial injury, overall doing okay. No increased pain.

## 2017-03-27 NOTE — Assessment & Plan Note (Signed)
The 10-year ASCVD risk score [name] DC Montez Hageman., et al., 2013) is: 4.9%   Values used to calculate the score:     Age: 51 years     Sex: Male     Is Non-Hispanic African American: No     Diabetic: Yes     Tobacco smoker: No     Systolic Blood Pressure: 104 mmHg     Is BP treated: No     HDL Cholesterol: 39.6 mg/dL     Total Cholesterol: 164 mg/dL  Not managed on statin, he would like to work on diet and exercise. Given ASCVD risk score this is appropriate. Will continue to monitor lipids and repeat in 6 months.

## 2017-03-27 NOTE — Patient Instructions (Addendum)
Please have the pharmacy call me when you are due for your next refill.  It is important that you improve your diet. Please limit carbohydrates in the form of white bread, rice, pasta, sweets, fast food, fried food, sugary drinks, etc. Increase your consumption of fresh fruits and vegetables, whole grains, lean protein.  Ensure you are consuming 64 ounces of water daily.  Start exercising. You should be getting 150 minutes of moderate intensity exercise weekly.  Ensure you are consuming 64 ounces of water daily.  Schedule a follow up visit in 6 months for re-evaluation or sooner if needed.  It was a pleasure to see you today!  Diabetes Mellitus and Food It is important for you to manage your blood sugar (glucose) level. Your blood glucose level can be greatly affected by what you eat. Eating healthier foods in the appropriate amounts throughout the day at about the same time each day will help you control your blood glucose level. It can also help slow or prevent worsening of your diabetes mellitus. Healthy eating may even help you improve the level of your blood pressure and reach or maintain a healthy weight. General recommendations for healthful eating and cooking habits include:  Eating meals and snacks regularly. Avoid going long periods of time without eating to lose weight.  Eating a diet that consists mainly of plant-based foods, such as fruits, vegetables, nuts, legumes, and whole grains.  Using low-heat cooking methods, such as baking, instead of high-heat cooking methods, such as deep frying.  Work with your dietitian to make sure you understand how to use the Nutrition Facts information on food labels. How can food affect me? Carbohydrates Carbohydrates affect your blood glucose level more than any other type of food. Your dietitian will help you determine how many carbohydrates to eat at each meal and teach you how to count carbohydrates. Counting carbohydrates is important to  keep your blood glucose at a healthy level, especially if you are using insulin or taking certain medicines for diabetes mellitus. Alcohol Alcohol can cause sudden decreases in blood glucose (hypoglycemia), especially if you use insulin or take certain medicines for diabetes mellitus. Hypoglycemia can be a life-threatening condition. Symptoms of hypoglycemia (sleepiness, dizziness, and disorientation) are similar to symptoms of having too much alcohol. If your health care provider has given you approval to drink alcohol, do so in moderation and use the following guidelines:  Women should not have more than one drink per day, and men should not have more than two drinks per day. One drink is equal to: ? 12 oz of beer. ? 5 oz of wine. ? 1 oz of hard liquor.  Do not drink on an empty stomach.  Keep yourself hydrated. Have water, diet soda, or unsweetened iced tea.  Regular soda, juice, and other mixers might contain a lot of carbohydrates and should be counted.  What foods are not recommended? As you make food choices, it is important to remember that all foods are not the same. Some foods have fewer nutrients per serving than other foods, even though they might have the same number of calories or carbohydrates. It is difficult to get your body what it needs when you eat foods with fewer nutrients. Examples of foods that you should avoid that are high in calories and carbohydrates but low in nutrients include:  Trans fats (most processed foods list trans fats on the Nutrition Facts label).  Regular soda.  Juice.  Candy.  Sweets, such as cake, pie,  doughnuts, and cookies.  Fried foods.  What foods can I eat? Eat nutrient-rich foods, which will nourish your body and keep you healthy. The food you should eat also will depend on several factors, including:  The calories you need.  The medicines you take.  Your weight.  Your blood glucose level.  Your blood pressure level.  Your  cholesterol level.  You should eat a variety of foods, including:  Protein. ? Lean cuts of meat. ? Proteins low in saturated fats, such as fish, egg whites, and beans. Avoid processed meats.  Fruits and vegetables. ? Fruits and vegetables that may help control blood glucose levels, such as apples, mangoes, and yams.  Dairy products. ? Choose fat-free or low-fat dairy products, such as milk, yogurt, and cheese.  Grains, bread, pasta, and rice. ? Choose whole grain products, such as multigrain bread, whole oats, and brown rice. These foods may help control blood pressure.  Fats. ? Foods containing healthful fats, such as nuts, avocado, olive oil, canola oil, and fish.  Does everyone with diabetes mellitus have the same meal plan? Because every person with diabetes mellitus is different, there is not one meal plan that works for everyone. It is very important that you meet with a dietitian who will help you create a meal plan that is just right for you. This information is not intended to replace advice given to you by your health care provider. Make sure you discuss any questions you have with your health care provider. Document Released: 04/19/2005 Document Revised: 12/29/2015 Document Reviewed: 06/19/2013 Elsevier Interactive Patient Education  2017 [employer].

## 2017-03-27 NOTE — Assessment & Plan Note (Signed)
Stable in the office today.  Continue to monitor. 

## 2017-04-09 ENCOUNTER — Other Ambulatory Visit: Payer: Self-pay | Admitting: Primary Care

## 2017-04-11 ENCOUNTER — Other Ambulatory Visit: Payer: Self-pay | Admitting: Primary Care

## 2017-04-11 DIAGNOSIS — G4700 Insomnia, unspecified: Secondary | ICD-10-CM

## 2017-04-11 MED ORDER — ZOLPIDEM TARTRATE 5 MG PO TABS: 5.0000 mg | ORAL_TABLET | Freq: Every evening | ORAL | 0 refills | Status: DC | PRN

## 2017-04-11 NOTE — Telephone Encounter (Signed)
Received faxed refill request for zolpidem (AMBIEN) 5 MG tablet  Last prescribed on 10/15/2016. Last seen on 03/27/2017. Patient is due for UDS.

## 2017-04-11 NOTE — Telephone Encounter (Deleted)
Ok to refill? Electronically refill request for  

## 2017-04-12 ENCOUNTER — Telehealth: Payer: Self-pay | Admitting: Radiology

## 2017-04-12 ENCOUNTER — Encounter: Payer: Self-pay | Admitting: Primary Care

## 2017-04-12 ENCOUNTER — Other Ambulatory Visit: Payer: Self-pay | Admitting: Primary Care

## 2017-04-12 DIAGNOSIS — G4700 Insomnia, unspecified: Secondary | ICD-10-CM

## 2017-04-12 DIAGNOSIS — Z79891 Long term (current) use of opiate analgesic: Secondary | ICD-10-CM | POA: Diagnosis not present

## 2017-04-12 DIAGNOSIS — Z79899 Other long term (current) drug therapy: Secondary | ICD-10-CM | POA: Diagnosis not present

## 2017-04-12 MED ORDER — ZOLPIDEM TARTRATE 5 MG PO TABS: 5.0000 mg | ORAL_TABLET | Freq: Every evening | ORAL | 0 refills | Status: DC | PRN

## 2017-04-12 NOTE — Telephone Encounter (Signed)
Rx printed and ready for pick up. Placed in Chan's inbox.

## 2017-04-12 NOTE — Telephone Encounter (Signed)
Sent msg via mychart letting pt know that he needs to update his CSA for refill

## 2017-04-12 NOTE — Telephone Encounter (Signed)
Patient wants to know when he can get a new RX, he has been out for 6 weeks because he lost his RX. Patient updated his UDS contract and gave a urine sample. Please call the patient

## 2017-04-15 DIAGNOSIS — Z1212 Encounter for screening for malignant neoplasm of rectum: Secondary | ICD-10-CM | POA: Diagnosis not present

## 2017-04-15 DIAGNOSIS — Z1211 Encounter for screening for malignant neoplasm of colon: Secondary | ICD-10-CM | POA: Diagnosis not present

## 2017-04-15 NOTE — Telephone Encounter (Signed)
Spoken to patient and was notified Rx is ready. Left in the front office.

## 2017-04-19 ENCOUNTER — Telehealth: Payer: Self-pay | Admitting: Primary Care

## 2017-04-19 NOTE — Telephone Encounter (Signed)
Please notify patient that his drug screen tested positive for [facility] which is seen in marijuana use. He cannot smoke marijuana when he's managed on Ambien. Any other future positive tests for THC with use of Ambien is grounds for discontinuation of Ambien. I know that he wasn't taking Ambien during this screen, so just an FYI.

## 2017-04-22 NOTE — Telephone Encounter (Signed)
Message left for patient to return my call.  

## 2017-04-30 NOTE — Telephone Encounter (Signed)
Message left for patient to return my call.  

## 2017-05-03 LAB — COLOGUARD: Cologuard: NEGATIVE

## 2017-05-21 DIAGNOSIS — F4323 Adjustment disorder with mixed anxiety and depressed mood: Secondary | ICD-10-CM | POA: Diagnosis not present

## 2017-05-24 DIAGNOSIS — F4323 Adjustment disorder with mixed anxiety and depressed mood: Secondary | ICD-10-CM | POA: Diagnosis not present

## 2017-06-03 DIAGNOSIS — F4323 Adjustment disorder with mixed anxiety and depressed mood: Secondary | ICD-10-CM | POA: Diagnosis not present

## 2017-06-11 DIAGNOSIS — F4323 Adjustment disorder with mixed anxiety and depressed mood: Secondary | ICD-10-CM | POA: Diagnosis not present

## 2017-07-04 DIAGNOSIS — F4323 Adjustment disorder with mixed anxiety and depressed mood: Secondary | ICD-10-CM | POA: Diagnosis not present

## 2017-07-10 ENCOUNTER — Other Ambulatory Visit: Payer: Self-pay | Admitting: Primary Care

## 2017-07-10 DIAGNOSIS — G4700 Insomnia, unspecified: Secondary | ICD-10-CM

## 2017-07-10 NOTE — Telephone Encounter (Signed)
Ok to refill? Electronically refill request for zolpidem (AMBIEN) 5 MG tablet  Last prescribed on 04/12/2017. Last seen on 03/27/2017

## 2017-07-10 NOTE — Telephone Encounter (Signed)
Okay to phone in, #90, no refills 

## 2017-07-11 NOTE — Telephone Encounter (Signed)
Called in medication to the pharmacy as instructed. 

## 2017-09-27 ENCOUNTER — Ambulatory Visit: Payer: BLUE CROSS/BLUE SHIELD | Admitting: Primary Care

## 2017-09-27 ENCOUNTER — Encounter: Payer: Self-pay | Admitting: Primary Care

## 2017-09-27 VITALS — BP 122/80 | HR 77 | Ht 73.0 in | Wt 201.8 lb

## 2017-09-27 DIAGNOSIS — E7800 Pure hypercholesterolemia, unspecified: Secondary | ICD-10-CM | POA: Diagnosis not present

## 2017-09-27 DIAGNOSIS — E119 Type 2 diabetes mellitus without complications: Secondary | ICD-10-CM

## 2017-09-27 DIAGNOSIS — F4323 Adjustment disorder with mixed anxiety and depressed mood: Secondary | ICD-10-CM | POA: Diagnosis not present

## 2017-09-27 LAB — HEMOGLOBIN A1C: HEMOGLOBIN A1C: 6.3 % (ref 4.6–6.5)

## 2017-09-27 NOTE — Assessment & Plan Note (Signed)
Lipid panel in August 2018 unremarkable.  Continue to monitor, repeat this August.

## 2017-09-27 NOTE — Progress Notes (Signed)
Subjective:    Patient ID: Cody Oneill, male    DOB: 1966-03-09, 52 y.o.   MRN: [id number]  HPI  Cody Oneill is a 52 year old male who presents today for follow up.  Current medications include: Metformin ER 500 mg once daily.   He does not currently check his blood sugars.   Last A1C: 6.2 in August 2018 Last Eye Exam: Has not completed Last Foot Exam: Completed in August 2018 Pneumonia Vaccination: Completed in 2017 ACE/ARB: Urine microalbumin negative in August 2018 Statin: None  Diet currently consists of:  Breakfast: Skips Lunch: Fast food Dinner: Fruit, vegetables, salad, meat Snacks: None Desserts: Chocolate, cake. Daily. Beverages: Coffee, water, soda  Exercise: He walks daily for 20 minutes to one hour.    Wt Readings from Last 3 Encounters:  09/27/17 201 lb 12.8 oz (91.5 kg)  03/27/17 204 lb 12 oz (92.9 kg)  02/27/17 206 lb 8 oz (93.7 kg)       Review of Systems  Constitutional: Negative for fatigue.  Respiratory: Negative for shortness of breath.   Cardiovascular: Negative for chest pain.  Neurological: Negative for dizziness and numbness.       Past Medical History:  Diagnosis Date  . Diverticulitis      Social History   Socioeconomic History  . Marital status: Married    Spouse name: Not on file  . Number of children: Not on file  . Years of education: Not on file  . Highest education level: Not on file  Social Needs  . Financial resource strain: Not on file  . Food insecurity - worry: Not on file  . Food insecurity - inability: Not on file  . Transportation needs - medical: Not on file  . Transportation needs - non-medical: Not on file  Occupational History  . Not on file  Tobacco Use  . Smoking status: Never Smoker  . Smokeless tobacco: Never Used  Substance and Sexual Activity  . Alcohol use: No    Alcohol/week: 0.0 oz  . Drug use: No  . Sexual activity: Not on file  Other Topics Concern  . Not on file  Social History  Narrative   Married.   3 children.   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   Enjoys playing tennis, going to the lake, snow skiing.     Past Surgical History:  Procedure Laterality Date  . COLON SURGERY  2006    No family history on file.  Allergies  Allergen Reactions  . Wasp Venom     Current Outpatient Medications on File Prior to Visit  Medication Sig Dispense Refill  . metFORMIN (GLUCOPHAGE-XR) 500 MG 24 hr tablet Take 1 tablet (500 mg total) by mouth daily with breakfast. 90 tablet 1  . zolpidem (AMBIEN) 5 MG tablet TAKE 1 TABLET BY MOUTH AT BEDTIME AS NEEDED FOR SLEEP 90 tablet 0   No current facility-administered medications on file prior to visit.     BP 122/80   Pulse 77   Ht 6\' 1"  (1.854 m)   Wt 201 lb 12.8 oz (91.5 kg)   SpO2 99%   BMI 26.62 kg/m    Objective:   Physical Exam  Constitutional: He is oriented to person, place, and time. He appears well-nourished.  Neck: Neck supple.  Cardiovascular: Normal rate and regular rhythm.  Pulmonary/Chest: Effort normal and breath sounds normal. He has no wheezes. He has no rales.  Neurological: He is alert and oriented to person, place,  and time.  Skin: Skin is warm and dry.          Assessment & Plan:

## 2017-09-27 NOTE — Patient Instructions (Addendum)
Stop by the lab prior to leaving today. I will notify you of your results once received.   Limit sodas and sweets. Increase vegetables, fruit, whole grains, water.  Continue exercising. You should be getting 150 minutes of moderate intensity exercise weekly.  Please schedule a physical with me in August 2019. You may also schedule a lab only appointment 3-4 days prior. We will discuss your lab results in detail during your physical.  It was a pleasure to see you today!

## 2017-09-27 NOTE — Assessment & Plan Note (Addendum)
Repeat A1c pending today. Urine microalbumin up-to-date. Pneumonia vaccination up-to-date. Foot exam up-to-date. Not managed on statin or ACE.  Lipids and urine microalbumin negative. Follow-up in 6 months for diabetes check.

## 2017-10-09 DIAGNOSIS — F4323 Adjustment disorder with mixed anxiety and depressed mood: Secondary | ICD-10-CM | POA: Diagnosis not present

## 2017-12-02 DIAGNOSIS — F4323 Adjustment disorder with mixed anxiety and depressed mood: Secondary | ICD-10-CM | POA: Diagnosis not present

## 2017-12-06 DIAGNOSIS — F4323 Adjustment disorder with mixed anxiety and depressed mood: Secondary | ICD-10-CM | POA: Diagnosis not present

## 2018-01-03 ENCOUNTER — Other Ambulatory Visit: Payer: Self-pay | Admitting: Primary Care

## 2018-01-03 DIAGNOSIS — E1165 Type 2 diabetes mellitus with hyperglycemia: Secondary | ICD-10-CM

## 2018-01-03 DIAGNOSIS — G4700 Insomnia, unspecified: Secondary | ICD-10-CM

## 2018-01-03 DIAGNOSIS — F4323 Adjustment disorder with mixed anxiety and depressed mood: Secondary | ICD-10-CM | POA: Diagnosis not present

## 2018-01-06 NOTE — Telephone Encounter (Signed)
Ok to refill? Electronically refill request for metFORMIN (GLUCOPHAGE-XR) 500 MG 24 hr tablet Last prescribed on 04/09/2017.  zolpidem (AMBIEN) 5 MG tablet Last prescribed on 07/10/2017  Last seen on 03/27/2017. Next CPE on 04/03/2018

## 2018-01-06 NOTE — Telephone Encounter (Signed)
Refills sent to pharmacy. 

## 2018-02-03 DIAGNOSIS — F4323 Adjustment disorder with mixed anxiety and depressed mood: Secondary | ICD-10-CM | POA: Diagnosis not present

## 2018-03-10 DIAGNOSIS — F4323 Adjustment disorder with mixed anxiety and depressed mood: Secondary | ICD-10-CM | POA: Diagnosis not present

## 2018-03-19 ENCOUNTER — Other Ambulatory Visit: Payer: Self-pay | Admitting: Primary Care

## 2018-03-19 DIAGNOSIS — E119 Type 2 diabetes mellitus without complications: Secondary | ICD-10-CM

## 2018-03-19 DIAGNOSIS — E7800 Pure hypercholesterolemia, unspecified: Secondary | ICD-10-CM

## 2018-03-19 DIAGNOSIS — Z125 Encounter for screening for malignant neoplasm of prostate: Secondary | ICD-10-CM

## 2018-03-31 ENCOUNTER — Other Ambulatory Visit (INDEPENDENT_AMBULATORY_CARE_PROVIDER_SITE_OTHER): Payer: BLUE CROSS/BLUE SHIELD

## 2018-03-31 DIAGNOSIS — E119 Type 2 diabetes mellitus without complications: Secondary | ICD-10-CM | POA: Diagnosis not present

## 2018-03-31 DIAGNOSIS — Z125 Encounter for screening for malignant neoplasm of prostate: Secondary | ICD-10-CM

## 2018-03-31 DIAGNOSIS — E7800 Pure hypercholesterolemia, unspecified: Secondary | ICD-10-CM | POA: Diagnosis not present

## 2018-03-31 LAB — COMPREHENSIVE METABOLIC PANEL
ALBUMIN: 4.3 g/dL (ref 3.5–5.2)
ALT: 33 U/L (ref 0–53)
AST: 22 U/L (ref 0–37)
Alkaline Phosphatase: 66 U/L (ref 39–117)
BILIRUBIN TOTAL: 0.6 mg/dL (ref 0.2–1.2)
BUN: 14 mg/dL (ref 6–23)
CALCIUM: 9.7 mg/dL (ref 8.4–10.5)
CO2: 31 meq/L (ref 19–32)
CREATININE: 0.98 mg/dL (ref 0.40–1.50)
Chloride: 101 mEq/L (ref 96–112)
GFR: 85.2 mL/min (ref >60.00)
Glucose, Bld: 124 mg/dL — ABNORMAL HIGH (ref 70–99)
Potassium: 4.6 mEq/L (ref 3.5–5.1)
SODIUM: 138 meq/L (ref 135–145)
Total Protein: 6.9 g/dL (ref 6.0–8.3)

## 2018-03-31 LAB — HEMOGLOBIN A1C: Hgb A1c MFr Bld: 6.5 % (ref 4.6–6.5)

## 2018-03-31 LAB — LIPID PANEL
CHOL/HDL RATIO: 5
Cholesterol: 203 mg/dL — ABNORMAL HIGH (ref 0–200)
HDL: 44.5 mg/dL (ref >39.00)
NONHDL: 158.71
TRIGLYCERIDES: 268 mg/dL — AB (ref 0.0–149.0)
VLDL: 53.6 mg/dL — ABNORMAL HIGH (ref 0.0–40.0)

## 2018-03-31 LAB — LDL CHOLESTEROL, DIRECT: Direct LDL: 125 mg/dL

## 2018-03-31 LAB — MICROALBUMIN / CREATININE URINE RATIO
CREATININE, U: 136.8 mg/dL
Microalb Creat Ratio: 0.6 mg/g (ref 0.0–30.0)
Microalb, Ur: 0.8 mg/dL (ref 0.0–1.9)

## 2018-03-31 LAB — PSA: PSA: 1.07 ng/mL (ref 0.10–4.00)

## 2018-04-03 ENCOUNTER — Encounter: Payer: BLUE CROSS/BLUE SHIELD | Admitting: Primary Care

## 2018-04-11 ENCOUNTER — Encounter: Payer: BLUE CROSS/BLUE SHIELD | Admitting: Primary Care

## 2018-04-18 DIAGNOSIS — F4323 Adjustment disorder with mixed anxiety and depressed mood: Secondary | ICD-10-CM | POA: Diagnosis not present

## 2018-05-01 ENCOUNTER — Other Ambulatory Visit: Payer: Self-pay | Admitting: Primary Care

## 2018-05-01 DIAGNOSIS — E1165 Type 2 diabetes mellitus with hyperglycemia: Secondary | ICD-10-CM

## 2018-05-01 DIAGNOSIS — G4700 Insomnia, unspecified: Secondary | ICD-10-CM

## 2018-05-02 NOTE — Telephone Encounter (Signed)
Noted, refills sent to pharmacy. 

## 2018-05-02 NOTE — Telephone Encounter (Signed)
metFORMIN (GLUCOPHAGE-XR) 500 MG 24 hr tablet Last prescribed on 01/06/2018.    Name of Medication: zolpidem (AMBIEN) 5 MG tablet  Name of Pharmacy: [facility]  Last Fill or Written Date and Quantity: 01/06/2018  #90  Last Office Visit and Type: 03/27/2017 CPE  Next Office Visit and Type: 06/10/2018 CPE  Last Controlled Substance Agreement Date: 04/12/2017  Last UDS:04/12/2017

## 2018-05-16 DIAGNOSIS — F4323 Adjustment disorder with mixed anxiety and depressed mood: Secondary | ICD-10-CM | POA: Diagnosis not present

## 2018-06-09 DIAGNOSIS — F4323 Adjustment disorder with mixed anxiety and depressed mood: Secondary | ICD-10-CM | POA: Diagnosis not present

## 2018-06-10 ENCOUNTER — Ambulatory Visit (INDEPENDENT_AMBULATORY_CARE_PROVIDER_SITE_OTHER): Payer: BLUE CROSS/BLUE SHIELD | Admitting: Primary Care

## 2018-06-10 VITALS — BP 124/74 | HR 77 | Temp 98.3°F | Ht 73.0 in | Wt 215.7 lb

## 2018-06-10 DIAGNOSIS — Z0000 Encounter for general adult medical examination without abnormal findings: Secondary | ICD-10-CM

## 2018-06-10 DIAGNOSIS — E7800 Pure hypercholesterolemia, unspecified: Secondary | ICD-10-CM | POA: Diagnosis not present

## 2018-06-10 DIAGNOSIS — G4700 Insomnia, unspecified: Secondary | ICD-10-CM | POA: Diagnosis not present

## 2018-06-10 DIAGNOSIS — E119 Type 2 diabetes mellitus without complications: Secondary | ICD-10-CM | POA: Diagnosis not present

## 2018-06-10 MED ORDER — ATORVASTATIN CALCIUM 10 MG PO TABS: 10.0000 mg | ORAL_TABLET | Freq: Every day | ORAL | 3 refills | Status: DC

## 2018-06-10 NOTE — Assessment & Plan Note (Signed)
LDL above goal at 125. Given history of diabetes he will need statin therapy to prevent CVD. Rx for atorvastatin 10 mg sent to pharmacy.  Repeat lipids with LFT's in 6 weeks.

## 2018-06-10 NOTE — Assessment & Plan Note (Signed)
Doing well on Ambien for which he takes 2-3 times weekly on average, continue same.

## 2018-06-10 NOTE — Assessment & Plan Note (Addendum)
A1C of 6.5 on recent labs. Continue Metformin ER 500 mg daily.  Foot exam today. Encouraged an annual eye exam. Pneumonia vaccination UTD. Urine microalbumin negative.  Initiated statin therapy today.  Follow up in 6 months.

## 2018-06-10 NOTE — Patient Instructions (Signed)
Continue Metformin daily for diabetes.  Start atorvastatin 10 mg tablets for cholesterol. Take once daily.  Continue exercising. You should be getting 150 minutes of moderate intensity exercise weekly.  It is important that you improve your diet. Please limit carbohydrates in the form of white bread, rice, pasta, sweets, fast food, fried food, sugary drinks, etc. Increase your consumption of fresh fruits and vegetables, whole grains, lean protein.  Ensure you are consuming 64 ounces of water daily.  Schedule a lab only appointment for 6 weeks to return for repeat cholesterol labs. Make sure to fast 4 hours prior, you can have water and black coffee.  Please schedule a follow up appointment in 6 months for diabetes check.  It was a pleasure to see you today!   Preventive Care 40-64 Years, Male Preventive care refers to lifestyle choices and visits with your health care provider that can promote health and wellness. What does preventive care include?  A yearly physical exam. This is also called an annual well check.  Dental exams once or twice a year.  Routine eye exams. Ask your health care provider how often you should have your eyes checked.  Personal lifestyle choices, including: ? Daily care of your teeth and gums. ? Regular physical activity. ? Eating a healthy diet. ? Avoiding tobacco and drug use. ? Limiting alcohol use. ? Practicing safe sex. ? Taking low-dose aspirin every day starting at age 77. What happens during an annual well check? The services and screenings done by your health care provider during your annual well check will depend on your age, overall health, lifestyle risk factors, and family history of disease. Counseling Your health care provider may ask you questions about your:  Alcohol use.  Tobacco use.  Drug use.  Emotional well-being.  Home and relationship well-being.  Sexual activity.  Eating habits.  Work and work  Statistician.  Screening You may have the following tests or measurements:  Height, weight, and BMI.  Blood pressure.  Lipid and cholesterol levels. These may be checked every 5 years, or more frequently if you are over 46 years old.  Skin check.  Lung cancer screening. You may have this screening every year starting at age 47 if you have a 30-pack-year history of smoking and currently smoke or have quit within the past 15 years.  Fecal occult blood test (FOBT) of the stool. You may have this test every year starting at age 75.  Flexible sigmoidoscopy or colonoscopy. You may have a sigmoidoscopy every 5 years or a colonoscopy every 10 years starting at age 77.  Prostate cancer screening. Recommendations will vary depending on your family history and other risks.  Hepatitis C blood test.  Hepatitis B blood test.  Sexually transmitted disease (STD) testing.  Diabetes screening. This is done by checking your blood sugar (glucose) after you have not eaten for a while (fasting). You may have this done every 1-3 years.  Discuss your test results, treatment options, and if necessary, the need for more tests with your health care provider. Vaccines Your health care provider may recommend certain vaccines, such as:  Influenza vaccine. This is recommended every year.  Tetanus, diphtheria, and acellular pertussis (Tdap, Td) vaccine. You may need a Td booster every 10 years.  Varicella vaccine. You may need this if you have not been vaccinated.  Zoster vaccine. You may need this after age 107.  Measles, mumps, and rubella (MMR) vaccine. You may need at least one dose of MMR if  you were born in [birth year] or later. You may also need a second dose.  Pneumococcal 13-valent conjugate (PCV13) vaccine. You may need this if you have certain conditions and have not been vaccinated.  Pneumococcal polysaccharide (PPSV23) vaccine. You may need one or two doses if you smoke cigarettes or if you have  certain conditions.  Meningococcal vaccine. You may need this if you have certain conditions.  Hepatitis A vaccine. You may need this if you have certain conditions or if you travel or work in places where you may be exposed to hepatitis A.  Hepatitis B vaccine. You may need this if you have certain conditions or if you travel or work in places where you may be exposed to hepatitis B.  Haemophilus influenzae type b (Hib) vaccine. You may need this if you have certain risk factors.  Talk to your health care provider about which screenings and vaccines you need and how often you need them. This information is not intended to replace advice given to you by your health care provider. Make sure you discuss any questions you have with your health care provider. Document Released: 08/19/2015 Document Revised: 04/11/2016 Document Reviewed: 05/24/2015 Elsevier Interactive Patient Education  [employer].

## 2018-06-10 NOTE — Assessment & Plan Note (Signed)
Immunizations UTD. Declines influenza vaccination. Colon cancer screening UTD, due again in 2021. PSA UTD. Recommended regular exercise, healthy diet, limit fast food. Exam unremarkable. Labs reviewed. Follow up in 1 year for CPE.

## 2018-06-10 NOTE — Progress Notes (Signed)
Subjective:    Patient ID: Cody Oneill, male    DOB: [birth year], 52 y.o.   MRN: [id number]  HPI  Cody Oneill is a 52 year old male who presents today for complete physical.  Immunizations: -Tetanus: Completed in 2016 -Influenza: Declines  -Pneumonia:Completed in 2017  Diet: He endorses a healthy diet Breakfast: Meal shake, protein bar Lunch: Fast food Dinner: Meat, vegetable, starch, salad Snacks: None Desserts: Chocolate nightly  Beverages: Water, soda, milk  Exercise: He is walking and hiking 2-4 days weekly Eye exam: Never completed Dental exam: Completes annually  Colonoscopy: Completed Cologuard in 2018 PSA: Normal in 2019  Review of Systems  Constitutional: Negative for unexpected weight change.  HENT: Negative for rhinorrhea.   Respiratory: Negative for cough and shortness of breath.   Cardiovascular: Negative for chest pain.  Gastrointestinal: Negative for constipation and diarrhea.  Genitourinary: Negative for difficulty urinating.  Musculoskeletal: Negative for arthralgias and myalgias.  Skin: Negative for rash.  Allergic/Immunologic: Negative for environmental allergies.  Neurological: Negative for dizziness and numbness.       Occasional headache  Psychiatric/Behavioral: Negative for sleep disturbance.   BP Readings from Last 3 Encounters:  06/10/18 124/74  09/27/17 122/80  03/27/17 104/62   The 10-year ASCVD risk score [name] DC Jr., et al., 2013) is: 8.6%   Values used to calculate the score:     Age: 85 years     Sex: Male     Is Non-Hispanic African American: No     Diabetic: Yes     Tobacco smoker: No     Systolic Blood Pressure: 124 mmHg     Is BP treated: No     HDL Cholesterol: 44.5 mg/dL     Total Cholesterol: 203 mg/dL      Past Medical History:  Diagnosis Date  . Diverticulitis      Social History   Socioeconomic History  . Marital status: Married    Spouse name: Not on file  . Number of children: Not on file  . Years  of education: Not on file  . Highest education level: Not on file  Occupational History  . Not on file  Social Needs  . Financial resource strain: Not on file  . Food insecurity:    Worry: Not on file    Inability: Not on file  . Transportation needs:    Medical: Not on file    Non-medical: Not on file  Tobacco Use  . Smoking status: Never Smoker  . Smokeless tobacco: Never Used  Substance and Sexual Activity  . Alcohol use: No    Alcohol/week: 0.0 standard drinks  . Drug use: No  . Sexual activity: Not on file  Lifestyle  . Physical activity:    Days per week: Not on file    Minutes per session: Not on file  . Stress: Not on file  Relationships  . Social connections:    Talks on phone: Not on file    Gets together: Not on file    Attends religious service: Not on file    Active member of club or organization: Not on file    Attends meetings of clubs or organizations: Not on file    Relationship status: Not on file  . Intimate partner violence:    Fear of current or ex partner: Not on file    Emotionally abused: Not on file    Physically abused: Not on file    Forced sexual activity: Not on file  Other Topics Concern  . Not on file  Social History Narrative   Married.   3 children.   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   Enjoys playing tennis, going to the lake, snow skiing.     Past Surgical History:  Procedure Laterality Date  . COLON SURGERY  2006    No family history on file.  Allergies  Allergen Reactions  . Wasp Venom     Current Outpatient Medications on File Prior to Visit  Medication Sig Dispense Refill  . metFORMIN (GLUCOPHAGE-XR) 500 MG 24 hr tablet TAKE 1 TABLET BY MOUTH DAILY WITH BREAKFAST FOR DIABETES. 90 tablet 0  . zolpidem (AMBIEN) 5 MG tablet TAKE 1 TABLET BY MOUTH AT BEDTIME AS NEEDED FOR SLEEP 90 tablet 0   No current facility-administered medications on file prior to visit.     BP 124/74   Pulse 77   Temp 98.3 F (36.8  C) (Oral)   Ht 6\' 1"  (1.854 m)   Wt 215 lb 11.2 oz (97.8 kg)   SpO2 97%   BMI 28.46 kg/m    Objective:   Physical Exam  Constitutional: He is oriented to person, place, and time. He appears well-nourished.  HENT:  Mouth/Throat: No oropharyngeal exudate.  Eyes: Pupils are equal, round, and reactive to light. EOM are normal.  Neck: Neck supple. No thyromegaly present.  Cardiovascular: Normal rate and regular rhythm.  Respiratory: Effort normal and breath sounds normal.  GI: Soft. Bowel sounds are normal. There is no tenderness.  Musculoskeletal: Normal range of motion.  Neurological: He is alert and oriented to person, place, and time.  Skin: Skin is warm and dry.  Psychiatric: He has a normal mood and affect.           Assessment & Plan:

## 2018-07-09 DIAGNOSIS — F4323 Adjustment disorder with mixed anxiety and depressed mood: Secondary | ICD-10-CM | POA: Diagnosis not present

## 2018-07-23 ENCOUNTER — Other Ambulatory Visit (INDEPENDENT_AMBULATORY_CARE_PROVIDER_SITE_OTHER): Payer: BLUE CROSS/BLUE SHIELD

## 2018-07-23 DIAGNOSIS — E7800 Pure hypercholesterolemia, unspecified: Secondary | ICD-10-CM | POA: Diagnosis not present

## 2018-07-23 LAB — HEPATIC FUNCTION PANEL
ALBUMIN: 4.3 g/dL (ref 3.5–5.2)
ALT: 30 U/L (ref 0–53)
AST: 44 U/L — ABNORMAL HIGH (ref 0–37)
Alkaline Phosphatase: 72 U/L (ref 39–117)
Bilirubin, Direct: 0.1 mg/dL (ref 0.0–0.3)
TOTAL PROTEIN: 6.7 g/dL (ref 6.0–8.3)
Total Bilirubin: 0.5 mg/dL (ref 0.2–1.2)

## 2018-07-23 LAB — LIPID PANEL
Cholesterol: 116 mg/dL (ref 0–200)
HDL: 37.4 mg/dL — AB (ref >39.00)
LDL Cholesterol: 58 mg/dL (ref 0–99)
NonHDL: 79.09
TRIGLYCERIDES: 103 mg/dL (ref 0.0–149.0)
Total CHOL/HDL Ratio: 3
VLDL: 20.6 mg/dL (ref 0.0–40.0)

## 2018-08-08 DIAGNOSIS — F4323 Adjustment disorder with mixed anxiety and depressed mood: Secondary | ICD-10-CM | POA: Diagnosis not present

## 2018-08-28 ENCOUNTER — Other Ambulatory Visit: Payer: Self-pay | Admitting: Primary Care

## 2018-08-28 DIAGNOSIS — E1165 Type 2 diabetes mellitus with hyperglycemia: Secondary | ICD-10-CM

## 2018-09-10 DIAGNOSIS — D225 Melanocytic nevi of trunk: Secondary | ICD-10-CM | POA: Diagnosis not present

## 2018-09-10 DIAGNOSIS — D485 Neoplasm of uncertain behavior of skin: Secondary | ICD-10-CM | POA: Diagnosis not present

## 2018-09-10 DIAGNOSIS — X32XXXA Exposure to sunlight, initial encounter: Secondary | ICD-10-CM | POA: Diagnosis not present

## 2018-09-10 DIAGNOSIS — L570 Actinic keratosis: Secondary | ICD-10-CM | POA: Diagnosis not present

## 2018-09-10 DIAGNOSIS — Z1283 Encounter for screening for malignant neoplasm of skin: Secondary | ICD-10-CM | POA: Diagnosis not present

## 2018-10-06 DIAGNOSIS — D485 Neoplasm of uncertain behavior of skin: Secondary | ICD-10-CM | POA: Diagnosis not present

## 2018-10-06 DIAGNOSIS — L98429 Non-pressure chronic ulcer of back with unspecified severity: Secondary | ICD-10-CM | POA: Diagnosis not present

## 2018-12-05 ENCOUNTER — Other Ambulatory Visit: Payer: Self-pay | Admitting: Primary Care

## 2018-12-05 DIAGNOSIS — E119 Type 2 diabetes mellitus without complications: Secondary | ICD-10-CM

## 2018-12-09 ENCOUNTER — Other Ambulatory Visit (INDEPENDENT_AMBULATORY_CARE_PROVIDER_SITE_OTHER): Payer: BLUE CROSS/BLUE SHIELD

## 2018-12-09 ENCOUNTER — Other Ambulatory Visit: Payer: Self-pay

## 2018-12-09 DIAGNOSIS — E119 Type 2 diabetes mellitus without complications: Secondary | ICD-10-CM

## 2018-12-09 LAB — POCT GLYCOSYLATED HEMOGLOBIN (HGB A1C): Hemoglobin A1C: 7.8 % — AB (ref 4.0–5.6)

## 2018-12-10 ENCOUNTER — Encounter: Payer: Self-pay | Admitting: Primary Care

## 2018-12-10 ENCOUNTER — Ambulatory Visit (INDEPENDENT_AMBULATORY_CARE_PROVIDER_SITE_OTHER): Payer: BLUE CROSS/BLUE SHIELD | Admitting: Primary Care

## 2018-12-10 DIAGNOSIS — E1165 Type 2 diabetes mellitus with hyperglycemia: Secondary | ICD-10-CM | POA: Diagnosis not present

## 2018-12-10 MED ORDER — METFORMIN HCL ER 500 MG PO TB24: 1000.0000 mg | ORAL_TABLET | Freq: Every day | ORAL | 1 refills | Status: DC

## 2018-12-10 NOTE — Patient Instructions (Signed)
We've increased the dose of your Metformin ER to 1000 mg. Take 2 tablets by mouth every morning with breakfast.  It is important that you improve your diet. Please limit carbohydrates in the form of white bread, rice, pasta, sweets, fast food, fried food, sugary drinks, etc. Increase your consumption of fresh fruits and vegetables, whole grains, lean protein.  Ensure you are consuming 64 ounces of water daily.  Start exercising. You should be getting 150 minutes of moderate intensity exercise weekly.  Please schedule a physical with me in 6 months. You may also schedule a lab only appointment 3-4 days prior. We will discuss your lab results in detail during your physical.  It was a pleasure to see you today! Mayra Reel, NP-C

## 2018-12-10 NOTE — Progress Notes (Signed)
Subjective:    Patient ID: [name], male    DOB: Oct 27, 1965, 53 y.o.   MRN: [id number]  HPI  Virtual Visit via Video Note  I connected with Consepcion Hearingavid Boen on 12/10/18 at  8:00 AM EDT by a video enabled telemedicine application and verified that I am speaking with the correct person using two identifiers.  Location: Patient: Home Provider: Office   I discussed the limitations of evaluation and management by telemedicine and the availability of in person appointments. The patient expressed understanding and agreed to proceed.  History of Present Illness:  [name] is a 53 year old male who presents today for follow up of type 2 diabetes.  Current medications include: Metformin ER 500 mg daily  He/She is checking his/her blood glucose _ times daily and is getting readings of  Last A1C: 6.5 in August 2019, 7.8 in May 2020 Last Eye Exam: Last Foot Exam: Due next visit  Pneumonia Vaccination: Completed in 2017 ACE/ARB: Urine micro negative in August 2019 Statin: atorvastatin   Diet currently consists of:  Breakfast: Skips Lunch: Fast food, sandwich, chips Dinner: Meat, starch, vegetables  Snacks: None Desserts: Daily  Beverages: Orange juice, coffee, milk, some water  Exercise: He is not exercising      Observations/Objective:  Alert and oriented. Appears well, not sickly. No distress. Speaking in complete sentences.  Assessment and Plan:  See problem based charting.  Follow Up Instructions:  We've increased the dose of your Metformin ER to 1000 mg. Take 2 tablets by mouth every morning with breakfast.  It is important that you improve your diet. Please limit carbohydrates in the form of white bread, rice, pasta, sweets, fast food, fried food, sugary drinks, etc. Increase your consumption of fresh fruits and vegetables, whole grains, lean protein.  Ensure you are consuming 64 ounces of water daily.  Start exercising. You should be getting 150  minutes of moderate intensity exercise weekly.  Please schedule a physical with me in 6 months. You may also schedule a lab only appointment 3-4 days prior. We will discuss your lab results in detail during your physical.  It was a pleasure to see you today! Mayra ReelKate Eileen Kangas, NP-C    I discussed the assessment and treatment plan with the patient. The patient was provided an opportunity to ask questions and all were answered. The patient agreed with the plan and demonstrated an understanding of the instructions.   The patient was advised to call back or seek an in-person evaluation if the symptoms worsen or if the condition fails to improve as anticipated.    Doreene NestKatherine K Avayah Raffety, NP    Review of Systems  Eyes: Negative for visual disturbance.  Respiratory: Negative for shortness of breath.   Cardiovascular: Negative for chest pain.  Neurological: Negative for dizziness and numbness.       Past Medical History:  Diagnosis Date  . Diverticulitis      Social History   Socioeconomic History  . Marital status: Married    Spouse name: Not on file  . Number of children: Not on file  . Years of education: Not on file  . Highest education level: Not on file  Occupational History  . Not on file  Social Needs  . Financial resource strain: Not on file  . Food insecurity:    Worry: Not on file    Inability: Not on file  . Transportation needs:    Medical: Not on file    Non-medical: Not on  file  Tobacco Use  . Smoking status: Never Smoker  . Smokeless tobacco: Never Used  Substance and Sexual Activity  . Alcohol use: No    Alcohol/week: 0.0 standard drinks  . Drug use: No  . Sexual activity: Not on file  Lifestyle  . Physical activity:    Days per week: Not on file    Minutes per session: Not on file  . Stress: Not on file  Relationships  . Social connections:    Talks on phone: Not on file    Gets together: Not on file    Attends religious service: Not on file    Active  member of club or organization: Not on file    Attends meetings of clubs or organizations: Not on file    Relationship status: Not on file  . Intimate partner violence:    Fear of current or ex partner: Not on file    Emotionally abused: Not on file    Physically abused: Not on file    Forced sexual activity: Not on file  Other Topics Concern  . Not on file  Social History Narrative   Married.   3 children.   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   Enjoys playing tennis, going to the lake, snow skiing.     Past Surgical History:  Procedure Laterality Date  . COLON SURGERY  2006    No family history on file.  Allergies  Allergen Reactions  . Wasp Venom     Current Outpatient Medications on File Prior to Visit  Medication Sig Dispense Refill  . atorvastatin (LIPITOR) 10 MG tablet Take 1 tablet (10 mg total) by mouth daily. For cholesterol. 90 tablet 3  . zolpidem (AMBIEN) 5 MG tablet TAKE 1 TABLET BY MOUTH AT BEDTIME AS NEEDED FOR SLEEP 90 tablet 0   No current facility-administered medications on file prior to visit.     There were no vitals taken for this visit.   Objective:   Physical Exam  Constitutional: He is oriented to person, place, and time. He appears well-nourished.  Respiratory: Effort normal.  Neurological: He is alert and oriented to person, place, and time.  Psychiatric: He has a normal mood and affect.           Assessment & Plan:

## 2018-12-10 NOTE — Assessment & Plan Note (Signed)
Significant increase in A1C to 7.8 from 6.5. This is highly likely related to poor diet and lack of regular exercise.   Increase Metformin to 1000 mg daily, new Rx sent to pharmacy.   Managed on statin. Urine microalbumin negative and due in November 2020. Pneumonia vaccination UTD. Foot exam next visit.  Follow up in 6 months.

## 2019-02-16 DIAGNOSIS — D225 Melanocytic nevi of trunk: Secondary | ICD-10-CM | POA: Diagnosis not present

## 2019-02-16 DIAGNOSIS — Z1283 Encounter for screening for malignant neoplasm of skin: Secondary | ICD-10-CM | POA: Diagnosis not present

## 2019-03-24 ENCOUNTER — Ambulatory Visit (INDEPENDENT_AMBULATORY_CARE_PROVIDER_SITE_OTHER): Payer: BC Managed Care – PPO | Admitting: Family Medicine

## 2019-03-24 ENCOUNTER — Encounter: Payer: Self-pay | Admitting: Family Medicine

## 2019-03-24 DIAGNOSIS — R21 Rash and other nonspecific skin eruption: Secondary | ICD-10-CM | POA: Insufficient documentation

## 2019-03-24 MED ORDER — VALACYCLOVIR HCL 1 G PO TABS: 1000.0000 mg | ORAL_TABLET | Freq: Three times a day (TID) | ORAL | 0 refills | Status: DC

## 2019-03-24 MED ORDER — TRAMADOL HCL 50 MG PO TABS: 50.0000 mg | ORAL_TABLET | Freq: Three times a day (TID) | ORAL | 0 refills | Status: AC | PRN

## 2019-03-24 NOTE — Patient Instructions (Addendum)
Ibuprofen 800 mg  every 8 hours for pain.  Can  Tramadol for breaktthrough pain.  Start a course of valacyclovir. Call or email if pain is not controlled. Call if fever or redness spreading.

## 2019-03-24 NOTE — Progress Notes (Signed)
VIRTUAL VISIT Due to national recommendations of social distancing due to COVID 19, a virtual visit is felt to be most appropriate for this patient at this time.   I connected with the patient on 03/24/19 at 11:20 AM EDT by virtual telehealth platform and verified that I am speaking with the correct person using two identifiers.   I discussed the limitations, risks, security and privacy concerns of performing an evaluation and management service by  virtual telehealth platform and the availability of in person appointments. I also discussed with the patient that there may be a patient responsible charge related to this service. The patient expressed understanding and agreed to proceed.  Patient location: Home Provider [facility] Participants: Kerby NoraAmy Ewald Beg and Consepcion Hearingavid Brizzi   Chief Complaint  Patient presents with  . Rash    on Arms & Fingers    History of Present Illness: Rash This is a new problem. The current episode started in the past 7 days. The problem has been gradually worsening since onset. The affected locations include the right arm, right hand and right shoulder. The rash is characterized by redness, pain and blistering (not itchy, hand is very painful). He was exposed to plant contact. Pertinent negatives include no congestion, cough, eye pain, facial edema, fatigue, fever, joint pain, shortness of breath or sore throat. (Mild body achy) Past treatments include anti-itch cream (neosporin and cortisone). The treatment provided no relief. There is no history of allergies, asthma, eczema or varicella.  Sudden onset of rash.. not gradual spread of erythema. Ibuprofen 600 mg... helped minimaly.  Dentist gave hydrocodone given does not like to take given SE. COVID 19 screen No recent travel or known exposure to COVID19 The patient denies respiratory symptoms of COVID 19 at this time.  The importance of social distancing was discussed today.   Review of Systems   Constitutional: Negative for fatigue and fever.  HENT: Negative for congestion and sore throat.   Eyes: Negative for pain.  Respiratory: Negative for cough and shortness of breath.   Musculoskeletal: Negative for joint pain.  Skin: Positive for rash.      Past Medical History:  Diagnosis Date  . Diverticulitis     reports that he has never smoked. He has never used smokeless tobacco. He reports that he does not drink alcohol or use drugs.   Current Outpatient Medications:  .  atorvastatin (LIPITOR) 10 MG tablet, Take 1 tablet (10 mg total) by mouth daily. For cholesterol., Disp: 90 tablet, Rfl: 3 .  metFORMIN (GLUCOPHAGE-XR) 500 MG 24 hr tablet, Take 2 tablets (1,000 mg total) by mouth daily with breakfast. For diabetes., Disp: 180 tablet, Rfl: 1 .  zolpidem (AMBIEN) 5 MG tablet, TAKE 1 TABLET BY MOUTH AT BEDTIME AS NEEDED FOR SLEEP, Disp: 90 tablet, Rfl: 0   Observations/Objective: Temperature 98.8 F (37.1 C), temperature source Oral, height 6\' 1"  (1.854 m), weight 210 lb (95.3 kg).  Physical Exam  Physical Exam Constitutional:      General: The patient is not in acute distress. Pulmonary:     Effort: Pulmonary effort is normal. No respiratory distress.  Neurological:     Mental Status: The patient is alert and oriented to person, place, and time.  Psychiatric:        Mood and Affect: Mood normal.        Behavior: Behavior normal.  Skin: right palm below first digit,  And knuckle of first digit, palmar aspect of forearm and upper arm... with  blisters surrounded by erythema No wound or course of infeciton.   Assessment and Plan Rash Most consistent with  Shingles although in unusual area.Marland Kitchen arm and hand (it IS in a dermatomal distribution).  Unilateral blistering.. painful no itching.  No clear focus of infection and not acting typical of cellulitis.  Not clearly allergic.. no itching. Within 72 hour of onset. Treat with valacyclovir and pain control with ibuprofen 800  and valacyclovir. Tramadol for break through pain.  IF redness spreading... low threshold for coving for bacterial superinfection.. may start antibiotics or need in office exam.     I discussed the assessment and treatment plan with the patient. The patient was provided an opportunity to ask questions and all were answered. The patient agreed with the plan and demonstrated an understanding of the instructions.   The patient was advised to call back or seek an in-person evaluation if the symptoms worsen or if the condition fails to improve as anticipated.     Eliezer Lofts, MD

## 2019-03-24 NOTE — Assessment & Plan Note (Addendum)
Most consistent with  Shingles although in unusual area.Marland Kitchen arm and hand (it IS in a dermatomal distribution).  Unilateral blistering.. painful no itching.  No clear focus of infection and not acting typical of cellulitis.  Not clearly allergic.. no itching. Within 72 hour of onset. Treat with valacyclovir and pain control with ibuprofen 800 and valacyclovir. Tramadol for break through pain.  IF redness spreading... low threshold for coving for bacterial superinfection.. may start antibiotics or need in office exam.

## 2019-05-21 DIAGNOSIS — F4323 Adjustment disorder with mixed anxiety and depressed mood: Secondary | ICD-10-CM | POA: Diagnosis not present

## 2019-06-11 ENCOUNTER — Other Ambulatory Visit: Payer: Self-pay | Admitting: Primary Care

## 2019-06-11 DIAGNOSIS — E7800 Pure hypercholesterolemia, unspecified: Secondary | ICD-10-CM

## 2019-06-12 ENCOUNTER — Other Ambulatory Visit: Payer: Self-pay

## 2019-06-12 ENCOUNTER — Encounter: Payer: Self-pay | Admitting: Primary Care

## 2019-08-20 ENCOUNTER — Ambulatory Visit: Payer: HRSA Program | Attending: Internal Medicine

## 2019-08-20 ENCOUNTER — Ambulatory Visit: Payer: Self-pay | Admitting: *Deleted

## 2019-08-20 DIAGNOSIS — U071 COVID-19: Secondary | ICD-10-CM | POA: Diagnosis not present

## 2019-08-20 DIAGNOSIS — Z20822 Contact with and (suspected) exposure to covid-19: Secondary | ICD-10-CM

## 2019-08-20 NOTE — Telephone Encounter (Signed)
Patient scheduled for Covid 19 testing this afternoon now with early morning fever. Advised tylenol or ibuprofen now continue to quarantine until test results. Monitor your breathing for any concerns call your pcp.  Reason for Disposition . Health Information question, no triage required and triager able to answer question  Answer Assessment - Initial Assessment Questions 1. REASON FOR CALL or QUESTION: "What is your reason for calling today?" or "How can I best help you?" or "What question do you have that I can help answer?"     Information regarding medication prior to covid testing today  Protocols used: INFORMATION ONLY CALL-A-AH

## 2019-08-21 LAB — NOVEL CORONAVIRUS, NAA: SARS-CoV-2, NAA: DETECTED — AB

## 2019-08-22 ENCOUNTER — Telehealth: Payer: Self-pay | Admitting: Unknown Physician Specialty

## 2019-08-22 NOTE — Telephone Encounter (Signed)
Called to discuss with patient about Covid symptoms and the use of bamlanivimab, a monoclonal antibody infusion for those with mild to moderate Covid symptoms and at a high risk of hospitalization.  Pt is qualified for this infusion at the [facility] due to Diabetes   Message left to call back  

## 2019-10-28 ENCOUNTER — Other Ambulatory Visit: Payer: Self-pay | Admitting: Primary Care

## 2019-10-28 DIAGNOSIS — E1165 Type 2 diabetes mellitus with hyperglycemia: Secondary | ICD-10-CM

## 2019-12-11 ENCOUNTER — Other Ambulatory Visit: Payer: Self-pay | Admitting: Primary Care

## 2019-12-11 DIAGNOSIS — E7800 Pure hypercholesterolemia, unspecified: Secondary | ICD-10-CM

## 2020-02-01 ENCOUNTER — Other Ambulatory Visit: Payer: Self-pay | Admitting: Primary Care

## 2020-02-01 DIAGNOSIS — E1165 Type 2 diabetes mellitus with hyperglycemia: Secondary | ICD-10-CM

## 2020-04-18 DIAGNOSIS — F4323 Adjustment disorder with mixed anxiety and depressed mood: Secondary | ICD-10-CM | POA: Diagnosis not present

## 2020-05-05 ENCOUNTER — Other Ambulatory Visit: Payer: Self-pay | Admitting: Primary Care

## 2020-05-05 DIAGNOSIS — E1165 Type 2 diabetes mellitus with hyperglycemia: Secondary | ICD-10-CM

## 2020-05-05 DIAGNOSIS — E7800 Pure hypercholesterolemia, unspecified: Secondary | ICD-10-CM

## 2020-07-14 ENCOUNTER — Other Ambulatory Visit: Payer: Self-pay | Admitting: Primary Care

## 2020-07-14 DIAGNOSIS — E7800 Pure hypercholesterolemia, unspecified: Secondary | ICD-10-CM

## 2020-07-14 DIAGNOSIS — E1165 Type 2 diabetes mellitus with hyperglycemia: Secondary | ICD-10-CM

## 2020-07-14 NOTE — Telephone Encounter (Signed)
Reject all refill requests if he has been notified that he needs to schedule an appointment for follow-up. He has had ample time to get in for follow-up.

## 2020-07-14 NOTE — Telephone Encounter (Signed)
When last refill was sent in message that he needed to make app. Number we have is not working for patient. I have sent my chart message to call of appointment. Do you want me to send in 15 day supply with appointment message?

## 2020-07-15 NOTE — Telephone Encounter (Signed)
No. He's been notified.

## 2020-07-18 NOTE — Telephone Encounter (Signed)
Pt called in wanted to know about getting a refill on his metformin, he is set to have his cpe on 08/22/2020

## 2020-07-18 NOTE — Telephone Encounter (Signed)
Yes, okay to send in the exact amount he may need until seen.

## 2020-07-18 NOTE — Telephone Encounter (Signed)
Ok to call in amount until follow up?

## 2020-07-21 MED ORDER — METFORMIN HCL ER 500 MG PO TB24: ORAL_TABLET | ORAL | 0 refills | Status: DC

## 2020-07-21 MED ORDER — ATORVASTATIN CALCIUM 10 MG PO TABS: 10.0000 mg | ORAL_TABLET | Freq: Every day | ORAL | 0 refills | Status: DC

## 2020-07-21 NOTE — Addendum Note (Signed)
Addended by: Donnamarie Poag on: 07/21/2020 01:07 PM   Modules accepted: Orders

## 2020-07-21 NOTE — Telephone Encounter (Signed)
Refill sent in to last until follow up

## 2020-08-22 ENCOUNTER — Encounter: Payer: Self-pay | Admitting: Primary Care

## 2020-08-23 ENCOUNTER — Other Ambulatory Visit: Payer: Self-pay | Admitting: Primary Care

## 2020-08-23 DIAGNOSIS — E1165 Type 2 diabetes mellitus with hyperglycemia: Secondary | ICD-10-CM

## 2020-08-23 DIAGNOSIS — E7800 Pure hypercholesterolemia, unspecified: Secondary | ICD-10-CM

## 2020-08-30 ENCOUNTER — Encounter: Payer: Self-pay | Admitting: Primary Care

## 2020-08-30 ENCOUNTER — Ambulatory Visit (INDEPENDENT_AMBULATORY_CARE_PROVIDER_SITE_OTHER): Payer: 59 | Admitting: Primary Care

## 2020-08-30 ENCOUNTER — Other Ambulatory Visit: Payer: Self-pay

## 2020-08-30 VITALS — BP 118/58 | HR 71 | Temp 97.7°F | Ht 73.0 in | Wt 213.0 lb

## 2020-08-30 DIAGNOSIS — N529 Male erectile dysfunction, unspecified: Secondary | ICD-10-CM | POA: Diagnosis not present

## 2020-08-30 DIAGNOSIS — Z1211 Encounter for screening for malignant neoplasm of colon: Secondary | ICD-10-CM

## 2020-08-30 DIAGNOSIS — Z0000 Encounter for general adult medical examination without abnormal findings: Secondary | ICD-10-CM

## 2020-08-30 DIAGNOSIS — E7800 Pure hypercholesterolemia, unspecified: Secondary | ICD-10-CM | POA: Diagnosis not present

## 2020-08-30 DIAGNOSIS — Z125 Encounter for screening for malignant neoplasm of prostate: Secondary | ICD-10-CM

## 2020-08-30 DIAGNOSIS — G4700 Insomnia, unspecified: Secondary | ICD-10-CM

## 2020-08-30 DIAGNOSIS — E1165 Type 2 diabetes mellitus with hyperglycemia: Secondary | ICD-10-CM

## 2020-08-30 DIAGNOSIS — R030 Elevated blood-pressure reading, without diagnosis of hypertension: Secondary | ICD-10-CM

## 2020-08-30 LAB — CBC
HCT: 44.6 % (ref 39.0–52.0)
Hemoglobin: 14.9 g/dL (ref 13.0–17.0)
MCHC: 33.5 g/dL (ref 30.0–36.0)
MCV: 87.2 fl (ref 78.0–100.0)
Platelets: 213 10*3/uL (ref 150.0–400.0)
RBC: 5.12 Mil/uL (ref 4.22–5.81)
RDW: 13.9 % (ref 11.5–15.5)
WBC: 7.8 10*3/uL (ref 4.0–10.5)

## 2020-08-30 LAB — COMPREHENSIVE METABOLIC PANEL
ALT: 24 U/L (ref 0–53)
AST: 15 U/L (ref 0–37)
Albumin: 4.3 g/dL (ref 3.5–5.2)
Alkaline Phosphatase: 80 U/L (ref 39–117)
BUN: 10 mg/dL (ref 6–23)
CO2: 29 mEq/L (ref 19–32)
Calcium: 9.3 mg/dL (ref 8.4–10.5)
Chloride: 102 mEq/L (ref 96–112)
Creatinine, Ser: 0.89 mg/dL (ref 0.40–1.50)
GFR: 96.88 mL/min (ref >60.00)
Glucose, Bld: 234 mg/dL — ABNORMAL HIGH (ref 70–99)
Potassium: 4.5 mEq/L (ref 3.5–5.1)
Sodium: 137 mEq/L (ref 135–145)
Total Bilirubin: 0.4 mg/dL (ref 0.2–1.2)
Total Protein: 6.4 g/dL (ref 6.0–8.3)

## 2020-08-30 LAB — POCT GLYCOSYLATED HEMOGLOBIN (HGB A1C): Hemoglobin A1C: 6.9 % — AB (ref 4.0–5.6)

## 2020-08-30 LAB — MICROALBUMIN / CREATININE URINE RATIO
Creatinine,U: 8.7 mg/dL
Microalb Creat Ratio: 8.1 mg/g (ref 0.0–30.0)
Microalb, Ur: <0.7 mg/dL (ref 0.0–1.9)

## 2020-08-30 LAB — LIPID PANEL
Cholesterol: 126 mg/dL (ref 0–200)
HDL: 36.9 mg/dL — ABNORMAL LOW (ref >39.00)
LDL Cholesterol: 61 mg/dL (ref 0–99)
NonHDL: 89.36
Total CHOL/HDL Ratio: 3
Triglycerides: 142 mg/dL (ref 0.0–149.0)
VLDL: 28.4 mg/dL (ref 0.0–40.0)

## 2020-08-30 LAB — PSA: PSA: 1.07 ng/mL (ref 0.10–4.00)

## 2020-08-30 MED ORDER — ATORVASTATIN CALCIUM 10 MG PO TABS
ORAL_TABLET | ORAL | 3 refills | Status: DC
Start: 2020-08-30 — End: 2021-12-13

## 2020-08-30 MED ORDER — SILDENAFIL CITRATE 20 MG PO TABS: ORAL_TABLET | ORAL | 0 refills | Status: DC

## 2020-08-30 MED ORDER — METFORMIN HCL ER 500 MG PO TB24: ORAL_TABLET | ORAL | 3 refills | Status: DC

## 2020-08-30 NOTE — Patient Instructions (Addendum)
Stop by the lab prior to leaving today. I will notify you of your results once received.   Start exercising. You should be getting 150 minutes of moderate intensity exercise weekly.  It's important to improve your diet by reducing consumption of fast food, fried food, processed snack foods, sugary drinks. Increase consumption of fresh vegetables and fruits, whole grains, water.  Ensure you are drinking 64 ounces of water daily.  Please make appointment for eye exam soon.   Return the cologuard kit as discussed.  Please schedule a follow up appointment in 6 months for diabetes check.   It was a pleasure to see you today!   Preventive Care 32-76 Years Old, Male Preventive care refers to lifestyle choices and visits with your health care provider that can promote health and wellness. This includes:  A yearly physical exam. This is also called an annual wellness visit.  Regular dental and eye exams.  Immunizations.  Screening for certain conditions.  Healthy lifestyle choices, such as: ? Eating a healthy diet. ? Getting regular exercise. ? Not using drugs or products that contain nicotine and tobacco. ? Limiting alcohol use. What can I expect for my preventive care visit? Physical exam Your health care provider will check your:  Height and weight. These may be used to calculate your BMI (body mass index). BMI is a measurement that tells if you are at a healthy weight.  Heart rate and blood pressure.  Body temperature.  Skin for abnormal spots. Counseling Your health care provider may ask you questions about your:  Past medical problems.  Family's medical history.  Alcohol, tobacco, and drug use.  Emotional well-being.  Home life and relationship well-being.  Sexual activity.  Diet, exercise, and sleep habits.  Work and work Statistician.  Access to firearms. What immunizations do I need? Vaccines are usually given at various ages, according to a schedule.  Your health care provider will recommend vaccines for you based on your age, medical history, and lifestyle or other factors, such as travel or where you work.   What tests do I need? Blood tests  Lipid and cholesterol levels. These may be checked every 5 years, or more often if you are over 34 years old.  Hepatitis C test.  Hepatitis B test. Screening  Lung cancer screening. You may have this screening every year starting at age 20 if you have a 30-pack-year history of smoking and currently smoke or have quit within the past 15 years.  Prostate cancer screening. Recommendations will vary depending on your family history and other risks.  Genital exam to check for testicular cancer or hernias.  Colorectal cancer screening. ? All adults should have this screening starting at age 73 and continuing until age 30. ? Your health care provider may recommend screening at age 67 if you are at increased risk. ? You will have tests every 1-10 years, depending on your results and the type of screening test.  Diabetes screening. ? This is done by checking your blood sugar (glucose) after you have not eaten for a while (fasting). ? You may have this done every 1-3 years.  STD (sexually transmitted disease) testing, if you are at risk. Follow these instructions at home: Eating and drinking  Eat a diet that includes fresh fruits and vegetables, whole grains, lean protein, and low-fat dairy products.  Take vitamin and mineral supplements as recommended by your health care provider.  Do not drink alcohol if your health care provider tells you not  to drink.  If you drink alcohol: ? Limit how much you have to 0-2 drinks a day. ? Be aware of how much alcohol is in your drink. In the [place] one drink equals one 12 oz bottle of beer (355 mL), one 5 oz glass of wine (148 mL), or one 1 oz glass of hard liquor (44 mL).   Lifestyle  Take daily care of your teeth and gums. Brush your teeth every morning  and night with fluoride toothpaste. Floss one time each day.  Stay active. Exercise for at least 30 minutes 5 or more days each week.  Do not use any products that contain nicotine or tobacco, such as cigarettes, e-cigarettes, and chewing tobacco. If you need help quitting, ask your health care provider.  Do not use drugs.  If you are sexually active, practice safe sex. Use a condom or other form of protection to prevent STIs (sexually transmitted infections).  If told by your health care provider, take low-dose aspirin daily starting at age 24.  Find healthy ways to cope with stress, such as: ? Meditation, yoga, or listening to music. ? Journaling. ? Talking to a trusted person. ? Spending time with friends and family. Safety  Always wear your seat belt while driving or riding in a vehicle.  Do not drive: ? If you have been drinking alcohol. Do not ride with someone who has been drinking. ? When you are tired or distracted. ? While texting.  Wear a helmet and other protective equipment during sports activities.  If you have firearms in your house, make sure you follow all gun safety procedures. What's next?  Go to your health care provider once a year for an annual wellness visit.  Ask your health care provider how often you should have your eyes and teeth checked.  Stay up to date on all vaccines. This information is not intended to replace advice given to you by your health care provider. Make sure you discuss any questions you have with your health care provider. Document Revised: 04/21/2019 Document Reviewed: 07/17/2018 Elsevier Patient Education  2021 [employer].

## 2020-08-30 NOTE — Assessment & Plan Note (Signed)
Well controlled in the office today. Continue to monitor.

## 2020-08-30 NOTE — Progress Notes (Signed)
Subjective:    Patient ID: Cody Oneill, male    DOB: 1966-04-24, 55 y.o.   MRN: [id number]  HPI  This visit occurred during the SARS-CoV-2 public health emergency.  Safety protocols were in place, including screening questions prior to the visit, additional usage of staff PPE, and extensive cleaning of exam room while observing appropriate contact time as indicated for disinfecting solutions.   Cody Oneill is a 55 year old male who presents today for complete physical.  He would also like to discuss erectile dysfunction. He has difficulty maintaining an erection, occurs nearly every time he has intercourse. Symptoms began a couple of years ago, symptoms are worse. He denies difficulty urinating, weak stream, dysuria, discharge, nocturia.   Immunizations: -Tetanus: 2016 -Influenza: Completed 2 months ago  -Shingles: Completed series -Pneumonia: 2017 -Covid-19: Completed all three vaccines  Diet: He endorses a poor diet.  Exercise: He is walking a lot during the day.  Eye exam: No recent exam Dental exam: Completed one year ago  Colonoscopy: Cologuard in 2018-negative, due PSA: Due Hep C Screen:  BP Readings from Last 3 Encounters:  08/30/20 (!) 118/58  06/10/18 124/74  09/27/17 122/80     Review of Systems  Constitutional: Negative for unexpected weight change.  HENT: Negative for rhinorrhea.   Respiratory: Negative for cough and shortness of breath.   Cardiovascular: Negative for chest pain.  Gastrointestinal: Negative for constipation and diarrhea.  Genitourinary: Negative for difficulty urinating and frequency.       Erectile dysfunction, difficulty maintaining erection, see HPI.  Musculoskeletal: Negative for arthralgias and myalgias.  Skin: Negative for rash.  Allergic/Immunologic: Negative for environmental allergies.  Neurological: Negative for dizziness, numbness and headaches.  Psychiatric/Behavioral: The patient is not nervous/anxious.        Past  Medical History:  Diagnosis Date  . Diverticulitis      Social History   Socioeconomic History  . Marital status: Married    Spouse name: Not on file  . Number of children: Not on file  . Years of education: Not on file  . Highest education level: Not on file  Occupational History  . Not on file  Tobacco Use  . Smoking status: Never Smoker  . Smokeless tobacco: Never Used  Substance and Sexual Activity  . Alcohol use: No    Alcohol/week: 0.0 standard drinks  . Drug use: No  . Sexual activity: Not on file  Other Topics Concern  . Not on file  Social History Narrative   Married.   3 children.   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   Enjoys playing tennis, going to the lake, snow skiing.    Social Determinants of Health   Financial Resource Strain: Not on file  Food Insecurity: Not on file  Transportation Needs: Not on file  Physical Activity: Not on file  Stress: Not on file  Social Connections: Not on file  Intimate Partner Violence: Not on file    Past Surgical History:  Procedure Laterality Date  . COLON SURGERY  2006    History reviewed. No pertinent family history.  No Active Allergies  No current outpatient medications on file prior to visit.   No current facility-administered medications on file prior to visit.    BP (!) 118/58   Pulse 71   Temp 97.7 F (36.5 C) (Temporal)   Ht 6\' 1"  (1.854 m)   Wt 213 lb (96.6 kg)   SpO2 97%   BMI 28.10 kg/m  Objective:   Physical Exam Constitutional:      Appearance: He is well-nourished.  HENT:     Right Ear: Tympanic membrane and ear canal normal.     Left Ear: Tympanic membrane and ear canal normal.     Mouth/Throat:     Mouth: Oropharynx is clear and moist.  Eyes:     Extraocular Movements: EOM normal.     Pupils: Pupils are equal, round, and reactive to light.  Cardiovascular:     Rate and Rhythm: Normal rate and regular rhythm.  Pulmonary:     Effort: Pulmonary effort is normal.      Breath sounds: Normal breath sounds.  Abdominal:     General: Bowel sounds are normal.     Palpations: Abdomen is soft.     Tenderness: There is no abdominal tenderness.  Musculoskeletal:        General: Normal range of motion.     Cervical back: Neck supple.  Skin:    General: Skin is warm and dry.  Neurological:     Mental Status: He is alert and oriented to person, place, and time.     Cranial Nerves: No cranial nerve deficit.     Deep Tendon Reflexes:     Reflex Scores:      Patellar reflexes are 2+ on the right side and 2+ on the left side. Psychiatric:        Mood and Affect: Mood and affect and mood normal.            Assessment & Plan:

## 2020-08-30 NOTE — Assessment & Plan Note (Signed)
Immunizations UTD, he will call around to provide [place] with dates of Covid and Shingles vaccines. PSA due and pending. Colon cancer screening due, he opts for Cologuard, declines colonoscopy.  Discussed the importance of a healthy diet and regular exercise in order for weight loss, and to reduce the risk of any potential medical problems.  Exam today as noted. Labs pending.

## 2020-08-30 NOTE — Assessment & Plan Note (Signed)
Denies concerns for insomnia, no longer on Ambien. Continue to monitor.

## 2020-08-30 NOTE — Assessment & Plan Note (Signed)
Repeat lipid panel pending. Continue atorvastatin 10 mg.  

## 2020-08-30 NOTE — Assessment & Plan Note (Addendum)
No recent follow up. He is compliant to his Metformin XR 500 mg, 2 tablets daily.   A1C today of 6.9 which is an improvement from last time. Encouraged a healthy diet and regular exercise.  Managed on statin. Urine micro due and pending. He will schedule eye exam. Foot exam next visit.  Follow up in 6 months.

## 2020-08-30 NOTE — Assessment & Plan Note (Signed)
Chronic for years, no other LUTS, just erection difficulty. Discussed options for treatment, he would like to try sildenafil.  Rx for sildenafil 20 mg sent to pharmacy. Discussed instructions. He will update.

## 2020-09-09 LAB — COLOGUARD: Cologuard: NEGATIVE

## 2020-09-16 LAB — COLOGUARD: COLOGUARD: NEGATIVE

## 2020-09-19 ENCOUNTER — Encounter: Payer: Self-pay | Admitting: Primary Care

## 2021-02-28 ENCOUNTER — Ambulatory Visit: Payer: 59 | Admitting: Primary Care

## 2021-02-28 DIAGNOSIS — E1165 Type 2 diabetes mellitus with hyperglycemia: Secondary | ICD-10-CM

## 2021-04-18 ENCOUNTER — Encounter: Payer: Self-pay | Admitting: Primary Care

## 2021-04-18 ENCOUNTER — Ambulatory Visit (INDEPENDENT_AMBULATORY_CARE_PROVIDER_SITE_OTHER)
Admission: RE | Admit: 2021-04-18 | Discharge: 2021-04-18 | Disposition: A | Discharge status: Home / Self Care | Payer: 59 | Source: Ambulatory Visit | Attending: Primary Care | Admitting: Primary Care

## 2021-04-18 ENCOUNTER — Other Ambulatory Visit: Payer: Self-pay

## 2021-04-18 ENCOUNTER — Ambulatory Visit (INDEPENDENT_AMBULATORY_CARE_PROVIDER_SITE_OTHER): Payer: 59 | Admitting: Primary Care

## 2021-04-18 VITALS — BP 110/62 | HR 69 | Temp 97.2°F | Ht 73.0 in | Wt 207.0 lb

## 2021-04-18 DIAGNOSIS — M25531 Pain in right wrist: Secondary | ICD-10-CM

## 2021-04-18 DIAGNOSIS — E1165 Type 2 diabetes mellitus with hyperglycemia: Secondary | ICD-10-CM

## 2021-04-18 DIAGNOSIS — Z23 Encounter for immunization: Secondary | ICD-10-CM

## 2021-04-18 HISTORY — DX: Pain in right wrist: M25.531

## 2021-04-18 LAB — POCT GLYCOSYLATED HEMOGLOBIN (HGB A1C): Hemoglobin A1C: 6.9 % — AB (ref 4.0–5.6)

## 2021-04-18 LAB — CBC
HCT: 44.4 % (ref 39.0–52.0)
Hemoglobin: 14.5 g/dL (ref 13.0–17.0)
MCHC: 32.7 g/dL (ref 30.0–36.0)
MCV: 88.3 fl (ref 78.0–100.0)
Platelets: 328 10*3/uL (ref 150.0–400.0)
RBC: 5.03 Mil/uL (ref 4.22–5.81)
RDW: 14.4 % (ref 11.5–15.5)
WBC: 6 10*3/uL (ref 4.0–10.5)

## 2021-04-18 LAB — SEDIMENTATION RATE: Sed Rate: 4 mm/hr (ref 0–20)

## 2021-04-18 LAB — URIC ACID: Uric Acid, Serum: 5.7 mg/dL (ref 4.0–7.8)

## 2021-04-18 NOTE — Assessment & Plan Note (Signed)
Without trauma.   Exam today overall benign. Checking sed rate, CBC, uric acid level. Checking plain films of right wrist.   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 Consider sports medicine referral.

## 2021-04-18 NOTE — Progress Notes (Signed)
Subjective:    Patient ID: Cody Oneill, male    DOB: [birth year], 55 y.o.   MRN: [id number]  HPI  Cody Oneill is a very pleasant 55 y.o. male with a history of type 2 diabetes, hyperlipidemia who presents today for follow up of diabetes and for acute right wrist pain.   1) Type 2 Diabetes:   Current medications include: Metformin XR 1000 mg daily  Last A1C: 6.9 in January 2022, 6.9 today. Last Eye Exam: Due  Last Foot Exam: Due Pneumonia Vaccination: 2017 Urine Microalbumin: UTD Statin: atorvastatin    2) Wrist Pain: Acute to the right lateral wrist for the last two months. Several evenings ago he attempted to play tennis, had to stop due to his pain.   He works on a Animator for work, 1 hour daily total. He also began renovating his bathroom, using a Radio broadcast assistant yesterday, this did not increase his pain.   He denies injury/trauma, reduced ROM, radiation of pain, numbness/tingling. He was lifting a motor for his truck a few months ago, questions if his pain is secondary to that, never had an injury. Pain is no worse.  He wore an ACE wrap for a few weeks, no improvement. He doesn't require use of OTC medications for pain.     Review of Systems  Respiratory:  Negative for shortness of breath.   Cardiovascular:  Negative for chest pain.  Musculoskeletal:  Positive for arthralgias. Negative for joint swelling.  Skin:  Negative for color change.  Neurological:  Negative for numbness.        Past Medical History:  Diagnosis Date   Diverticulitis     Social History   Socioeconomic History   Marital status: Married    Spouse name: Not on file   Number of children: Not on file   Years of education: Not on file   Highest education level: Not on file  Occupational History   Not on file  Tobacco Use   Smoking status: Never   Smokeless tobacco: Never  Substance and Sexual Activity   Alcohol use: No    Alcohol/week: 0.0 standard drinks   Drug use: No   Sexual  activity: Not on file  Other Topics Concern   Not on file  Social History Narrative   Married.   3 children.   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   Enjoys playing tennis, going to the lake, snow skiing.    Social Determinants of Health   Financial Resource Strain: Not on file  Food Insecurity: Not on file  Transportation Needs: Not on file  Physical Activity: Not on file  Stress: Not on file  Social Connections: Not on file  Intimate Partner Violence: Not on file    Past Surgical History:  Procedure Laterality Date   COLON SURGERY  2006    History reviewed. No pertinent family history.  No Active Allergies  Current Outpatient Medications on File Prior to Visit  Medication Sig Dispense Refill   atorvastatin (LIPITOR) 10 MG tablet TAKE 1 TABLET BY MOUTH DAILY for cholesterol. 90 tablet 3   metFORMIN (GLUCOPHAGE-XR) 500 MG 24 hr tablet TAKE 2 TABLETS BY MOUTH DAILY WITH BREAKFAST FOR DIABETES. 180 tablet 3   sildenafil (REVATIO) 20 MG tablet Take 2-5 tablets by mouth 30-60 minutes prior to intercourse. (Patient not taking: Reported on 04/18/2021) 50 tablet 0   No current facility-administered medications on file prior to visit.    BP 110/62   Pulse 69  Temp (!) 97.2 F (36.2 C) (Temporal)   Ht 6\' 1"  (1.854 m)   Wt 207 lb (93.9 kg)   SpO2 97%   BMI 27.31 kg/m  Objective:   Physical Exam Cardiovascular:     Rate and Rhythm: Normal rate and regular rhythm.  Pulmonary:     Effort: Pulmonary effort is normal.     Breath sounds: Normal breath sounds. No wheezing or rales.  Musculoskeletal:     Right wrist: No swelling, deformity, tenderness or bony tenderness. Normal range of motion.     Left wrist: No swelling, deformity, tenderness or bony tenderness. Normal range of motion.     Cervical back: Neck supple.  Skin:    General: Skin is warm and dry.  Neurological:     Mental Status: He is alert and oriented to person, place, and time.           Assessment & Plan:      This visit occurred during the SARS-CoV-2 public health emergency.  Safety protocols were in place, including screening questions prior to the visit, additional usage of staff PPE, and extensive cleaning of exam room while observing appropriate contact time as indicated for disinfecting solutions.

## 2021-04-18 NOTE — Patient Instructions (Addendum)
Stop by the lab and xray prior to leaving today. I will notify you of your results once received.   Continue taking metformin for diabetes.   Schedule your eye exam.  We will see you in January for your physical.   It was a pleasure to see you today!       Influenza (Flu) Vaccine (Inactivated or Recombinant): What You Need to Know 1. Why get vaccinated? Influenza vaccine can prevent influenza (flu). Flu is a contagious disease that spreads around the [place] every year, usually between October and May. Anyone can get the flu, but it is more dangerous for some people. Infants and young children, people 59 years and older, pregnant people, and people with certain health conditions or a weakened immune system are at greatest risk of flu complications. Pneumonia, bronchitis, sinus infections, and ear infections are examples of flu-related complications. If you have a medical condition, such as heart disease, cancer, or diabetes, flu can make it worse. Flu can cause fever and chills, sore throat, muscle aches, fatigue, cough, headache, and runny or stuffy nose. Some people may have vomiting and diarrhea, though this is more common in children than adults. In an average year, thousands of people in the [place] die from flu, and many more are hospitalized. Flu vaccine prevents millions of illnesses and flu-related visits to the doctor each year. 2. Influenza vaccines CDC recommends everyone 6 months and older get vaccinated every flu season. Children 6 months through 36 years of age may need 2 doses during a single flu season. Everyone else needs only 1 dose each flu season. It takes about 2 weeks for protection to develop after vaccination. There are many flu viruses, and they are always changing. Each year a new flu vaccine is made to protect against the influenza viruses believed to be likely to cause disease in the upcoming flu season. Even when the vaccine doesn't exactly match  these viruses, it may still provide some protection. Influenza vaccine does not cause flu. Influenza vaccine may be given at the same time as other vaccines. 3. Talk with your health care provider Tell your vaccination provider if the person getting the vaccine: Has had an allergic reaction after a previous dose of influenza vaccine, or has any severe, life-threatening allergies Has ever had Guillain-Barr Syndrome (also called "GBS") In some cases, your health care provider may decide to postpone influenza vaccination until a future visit. Influenza vaccine can be administered at any time during pregnancy. People who are or will be pregnant during influenza season should receive inactivated influenza vaccine. People with minor illnesses, such as a cold, may be vaccinated. People who are moderately or severely ill should usually wait until they recover before getting influenza vaccine. Your health care provider can give you more information. 4. Risks of a vaccine reaction Soreness, redness, and swelling where the shot is given, fever, muscle aches, and headache can happen after influenza vaccination. There may be a very small increased risk of Guillain-Barr Syndrome (GBS) after inactivated influenza vaccine (the flu shot). Young children who get the flu shot along with pneumococcal vaccine (PCV13) and/or DTaP vaccine at the same time might be slightly more likely to have a seizure caused by fever. Tell your health care provider if a child who is getting flu vaccine has ever had a seizure. People sometimes faint after medical procedures, including vaccination. Tell your provider if you feel dizzy or have vision changes or ringing in the ears. As with any  medicine, there is a very remote chance of a vaccine causing a severe allergic reaction, other serious injury, or death. 5. What if there is a serious problem? An allergic reaction could occur after the vaccinated person leaves the clinic. If you  see signs of a severe allergic reaction (hives, swelling of the face and throat, difficulty breathing, a fast heartbeat, dizziness, or weakness), call 9-1-1 and get the person to the nearest hospital. For other signs that concern you, call your health care provider. Adverse reactions should be reported to the Vaccine Adverse Event Reporting System (VAERS). Your health care provider will usually file this report, or you can do it yourself. Visit the VAERS website at www.vaers.LAgents.no or call 309-874-9196. VAERS is only for reporting reactions, and VAERS staff members do not give medical advice. 6. The National Vaccine Injury Compensation Program The [employer] Vaccine Injury Compensation Program (VICP) is a federal program that was created to compensate people who may have been injured by certain vaccines. Claims regarding alleged injury or death due to vaccination have a time limit for filing, which may be as short as two years. Visit the VICP website at SpiritualWord.at or call 773-636-0827 to learn about the program and about filing a claim. 7. How can I learn more? Ask your health care provider. Call your local or state health department. Visit the website of the Food and Drug Administration (FDA) for vaccine package inserts and additional information at FinderList.no. Contact the Centers for Disease Control and Prevention (CDC): Call 878-075-3431 (1-800-CDC-INFO) or Visit CDC's website at BiotechRoom.com.cy. Vaccine Information Statement Inactivated Influenza Vaccine (03/11/2020) This information is not intended to replace advice given to you by your health care provider. Make sure you discuss any questions you have with your health care provider. Document Revised: 04/28/2020 Document Reviewed: 04/28/2020 Elsevier Patient Education  2022 [employer].

## 2021-04-18 NOTE — Assessment & Plan Note (Signed)
Controlled today with A1C of 6.9. Continue metformin XR 1000 mg daily  Foot exam today He will schedule eye exam Urine micro UTD. Managed on statin.   Follow up in early 2023

## 2021-08-31 ENCOUNTER — Ambulatory Visit (INDEPENDENT_AMBULATORY_CARE_PROVIDER_SITE_OTHER): Payer: BC Managed Care – PPO | Admitting: Primary Care

## 2021-08-31 ENCOUNTER — Other Ambulatory Visit: Payer: Self-pay

## 2021-08-31 VITALS — BP 110/54 | HR 72 | Temp 97.6°F | Ht 73.0 in | Wt 209.0 lb

## 2021-08-31 DIAGNOSIS — Z0000 Encounter for general adult medical examination without abnormal findings: Secondary | ICD-10-CM | POA: Diagnosis not present

## 2021-08-31 DIAGNOSIS — E7800 Pure hypercholesterolemia, unspecified: Secondary | ICD-10-CM

## 2021-08-31 DIAGNOSIS — E1165 Type 2 diabetes mellitus with hyperglycemia: Secondary | ICD-10-CM | POA: Diagnosis not present

## 2021-08-31 DIAGNOSIS — N529 Male erectile dysfunction, unspecified: Secondary | ICD-10-CM

## 2021-08-31 DIAGNOSIS — Z125 Encounter for screening for malignant neoplasm of prostate: Secondary | ICD-10-CM | POA: Diagnosis not present

## 2021-08-31 LAB — COMPREHENSIVE METABOLIC PANEL
ALT: 31 U/L (ref 0–53)
AST: 16 U/L (ref 0–37)
Albumin: 4.5 g/dL (ref 3.5–5.2)
Alkaline Phosphatase: 84 U/L (ref 39–117)
BUN: 18 mg/dL (ref 6–23)
CO2: 28 mEq/L (ref 19–32)
Calcium: 9.6 mg/dL (ref 8.4–10.5)
Chloride: 103 mEq/L (ref 96–112)
Creatinine, Ser: 0.96 mg/dL (ref 0.40–1.50)
GFR: 88.73 mL/min (ref >60.00)
Glucose, Bld: 255 mg/dL — ABNORMAL HIGH (ref 70–99)
Potassium: 4.7 mEq/L (ref 3.5–5.1)
Sodium: 138 mEq/L (ref 135–145)
Total Bilirubin: 0.6 mg/dL (ref 0.2–1.2)
Total Protein: 7 g/dL (ref 6.0–8.3)

## 2021-08-31 LAB — MICROALBUMIN / CREATININE URINE RATIO
Creatinine,U: 203.7 mg/dL
Microalb Creat Ratio: 0.8 mg/g (ref 0.0–30.0)
Microalb, Ur: 1.6 mg/dL (ref 0.0–1.9)

## 2021-08-31 LAB — PSA: PSA: 1.04 ng/mL (ref 0.10–4.00)

## 2021-08-31 LAB — LIPID PANEL
Cholesterol: 150 mg/dL (ref 0–200)
HDL: 40.2 mg/dL (ref >39.00)
LDL Cholesterol: 78 mg/dL (ref 0–99)
NonHDL: 109.71
Total CHOL/HDL Ratio: 4
Triglycerides: 158 mg/dL — ABNORMAL HIGH (ref 0.0–149.0)
VLDL: 31.6 mg/dL (ref 0.0–40.0)

## 2021-08-31 LAB — HEMOGLOBIN A1C: Hgb A1c MFr Bld: 9.6 % — ABNORMAL HIGH (ref 4.6–6.5)

## 2021-08-31 NOTE — Assessment & Plan Note (Signed)
Immunizations UTD. PSA due and pending. Colon cancer screening UTD, due 2025.  Discussed the importance of a healthy diet and regular exercise in order for weight loss, and to reduce the risk of further co-morbidity.  Exam today stable. Labs pending.

## 2021-08-31 NOTE — Patient Instructions (Signed)
Stop by the lab prior to leaving today. I will notify you of your results once received.  ° °It was a pleasure to see you today! ° °Preventive Care 40-56 Years Old, Male °Preventive care refers to lifestyle choices and visits with your health care provider that can promote health and wellness. Preventive care visits are also called wellness exams. °What can I expect for my preventive care visit? °Counseling °During your preventive care visit, your health care provider may ask about your: °Medical history, including: °Past medical problems. °Family medical history. °Current health, including: °Emotional well-being. °Home life and relationship well-being. °Sexual activity. °Lifestyle, including: °Alcohol, nicotine or tobacco, and drug use. °Access to firearms. °Diet, exercise, and sleep habits. °Safety issues such as seatbelt and bike helmet use. °Sunscreen use. °Work and work environment. °Physical exam °Your health care provider will check your: °Height and weight. These may be used to calculate your BMI (body mass index). BMI is a measurement that tells if you are at a healthy weight. °Waist circumference. This measures the distance around your waistline. This measurement also tells if you are at a healthy weight and may help predict your risk of certain diseases, such as type 2 diabetes and high blood pressure. °Heart rate and blood pressure. °Body temperature. °Skin for abnormal spots. °What immunizations do I need? °Vaccines are usually given at various ages, according to a schedule. Your health care provider will recommend vaccines for you based on your age, medical history, and lifestyle or other factors, such as travel or where you work. °What tests do I need? °Screening °Your health care provider may recommend screening tests for certain conditions. This may include: °Lipid and cholesterol levels. °Diabetes screening. This is done by checking your blood sugar (glucose) after you have not eaten for a while  (fasting). °Hepatitis B test. °Hepatitis C test. °HIV (human immunodeficiency virus) test. °STI (sexually transmitted infection) testing, if you are at risk. °Lung cancer screening. °Prostate cancer screening. °Colorectal cancer screening. °Talk with your health care provider about your test results, treatment options, and if necessary, the need for more tests. °Follow these instructions at home: °Eating and drinking ° °Eat a diet that includes fresh fruits and vegetables, whole grains, lean protein, and low-fat dairy products. °Take vitamin and mineral supplements as recommended by your health care provider. °Do not drink alcohol if your health care provider tells you not to drink. °If you drink alcohol: °Limit how much you have to 0-2 drinks a day. °Know how much alcohol is in your drink. In the [place] one drink equals one 12 oz bottle of beer (355 mL), one 5 oz glass of wine (148 mL), or one 1½ oz glass of hard liquor (44 mL). °Lifestyle °Brush your teeth every morning and night with fluoride toothpaste. Floss one time each day. °Exercise for at least 30 minutes 5 or more days each week. °Do not use any products that contain nicotine or tobacco. These products include cigarettes, chewing tobacco, and vaping devices, such as e-cigarettes. If you need help quitting, ask your health care provider. °Do not use drugs. °If you are sexually active, practice safe sex. Use a condom or other form of protection to prevent STIs. °Take aspirin only as told by your health care provider. Make sure that you understand how much to take and what form to take. Work with your health care provider to find out whether it is safe and beneficial for you to take aspirin daily. °Find healthy ways to manage stress,   such as: °Meditation, yoga, or listening to music. °Journaling. °Talking to a trusted person. °Spending time with friends and family. °Minimize exposure to UV radiation to reduce your risk of skin cancer. °Safety °Always wear  your seat belt while driving or riding in a vehicle. °Do not drive: °If you have been drinking alcohol. Do not ride with someone who has been drinking. °When you are tired or distracted. °While texting. °If you have been using any mind-altering substances or drugs. °Wear a helmet and other protective equipment during sports activities. °If you have firearms in your house, make sure you follow all gun safety procedures. °What's next? °Go to your health care provider once a year for an annual wellness visit. °Ask your health care provider how often you should have your eyes and teeth checked. °Stay up to date on all vaccines. °This information is not intended to replace advice given to you by your health care provider. Make sure you discuss any questions you have with your health care provider. °Document Revised: 01/18/2021 Document Reviewed: 01/18/2021 °Elsevier Patient Education © 2022 [employer] ° °

## 2021-08-31 NOTE — Assessment & Plan Note (Signed)
Continue atorvastatin 10 mg. Repeat lipid panel pending.  

## 2021-08-31 NOTE — Assessment & Plan Note (Signed)
Continue metformin XR 1000 mg. Repeat A1C pending.  Managed on statin. Urine microalbumin due and pending.  Eye exam due, he will schedule.  Follow up in 6 months

## 2021-08-31 NOTE — Progress Notes (Signed)
Subjective:    Patient ID: Cody Oneill, male    DOB: 27-May-1966, 56 y.o.   MRN: [id number]  HPI  [name] is a very pleasant 56 y.o. male who presents today for complete physical and follow up of chronic conditions.  Immunizations: -Tetanus: 2016 -Influenza: Completed this season -Covid-19: 4 vaccines  -Shingles: Completed Shingrix  Diet: Fair diet.  Exercise: Walks often in neighborhood  Eye exam: Completed years ago Dental exam: Completes semi-annually   Colonoscopy: Completed Cologuard in 2022, negative. Due 2025 PSA: Due  BP Readings from Last 3 Encounters:  08/31/21 (!) 110/54  04/18/21 110/62  08/30/20 (!) 118/58       Review of Systems  Constitutional:  Negative for unexpected weight change.  HENT:  Negative for rhinorrhea.   Respiratory:  Negative for cough and shortness of breath.   Cardiovascular:  Negative for chest pain.  Gastrointestinal:  Negative for constipation and diarrhea.  Genitourinary:  Negative for difficulty urinating.  Musculoskeletal:  Negative for arthralgias and myalgias.  Skin:  Negative for rash.  Allergic/Immunologic: Negative for environmental allergies.  Neurological:  Negative for dizziness, numbness and headaches.  Psychiatric/Behavioral:  The patient is not nervous/anxious.         Past Medical History:  Diagnosis Date   Diverticulitis     Social History   Socioeconomic History   Marital status: Married    Spouse name: Not on file   Number of children: Not on file   Years of education: Not on file   Highest education level: Not on file  Occupational History   Not on file  Tobacco Use   Smoking status: Never   Smokeless tobacco: Never  Substance and Sexual Activity   Alcohol use: No    Alcohol/week: 0.0 standard drinks   Drug use: No   Sexual activity: Not on file  Other Topics Concern   Not on file  Social History Narrative   Married.   3 children.   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    Enjoys playing tennis, [place], snow skiing.    Social Determinants of Health   Financial Resource Strain: Not on file  Food Insecurity: Not on file  Transportation Needs: Not on file  Physical Activity: Not on file  Stress: Not on file  Social Connections: Not on file  Intimate Partner Violence: Not on file    Past Surgical History:  Procedure Laterality Date   COLON SURGERY  2006    No family history on file.  No Active Allergies  Current Outpatient Medications on File Prior to Visit  Medication Sig Dispense Refill   atorvastatin (LIPITOR) 10 MG tablet TAKE 1 TABLET BY MOUTH DAILY for cholesterol. 90 tablet 3   metFORMIN (GLUCOPHAGE-XR) 500 MG 24 hr tablet TAKE 2 TABLETS BY MOUTH DAILY WITH BREAKFAST FOR DIABETES. 180 tablet 3   sildenafil (REVATIO) 20 MG tablet Take 2-5 tablets by mouth 30-60 minutes prior to intercourse. (Patient not taking: Reported on 04/18/2021) 50 tablet 0   No current facility-administered medications on file prior to visit.    BP (!) 110/54    Pulse 72    Temp 97.6 F (36.4 C) (Temporal)    Ht 6\' 1"  (1.854 m)    Wt 209 lb (94.8 kg)    SpO2 96%    BMI 27.57 kg/m  Objective:   Physical Exam HENT:     Right Ear: Tympanic membrane and ear canal normal.  Left Ear: Tympanic membrane and ear canal normal.     Nose: Nose normal.     Right Sinus: No maxillary sinus tenderness or frontal sinus tenderness.     Left Sinus: No maxillary sinus tenderness or frontal sinus tenderness.  Eyes:     Conjunctiva/sclera: Conjunctivae normal.  Neck:     Thyroid: No thyromegaly.     Vascular: No carotid bruit.  Cardiovascular:     Rate and Rhythm: Normal rate and regular rhythm.     Heart sounds: Normal heart sounds.  Pulmonary:     Effort: Pulmonary effort is normal.     Breath sounds: Normal breath sounds. No wheezing or rales.  Abdominal:     General: Bowel sounds are normal.     Palpations: Abdomen is soft.     Tenderness: There is no  abdominal tenderness.  Musculoskeletal:        General: Normal range of motion.     Cervical back: Neck supple.  Skin:    General: Skin is warm and dry.  Neurological:     Mental Status: He is alert and oriented to person, place, and time.     Cranial Nerves: No cranial nerve deficit.     Deep Tendon Reflexes: Reflexes are normal and symmetric.  Psychiatric:        Mood and Affect: Mood normal.          Assessment & Plan:      This visit occurred during the SARS-CoV-2 public health emergency.  Safety protocols were in place, including screening questions prior to the visit, additional usage of staff PPE, and extensive cleaning of exam room while observing appropriate contact time as indicated for disinfecting solutions.

## 2021-08-31 NOTE — Assessment & Plan Note (Signed)
Infrequent use of sildenafil 20 mg. Continue PRN use of sildenafil 20 mg.

## 2021-09-01 ENCOUNTER — Other Ambulatory Visit: Payer: Self-pay | Admitting: Primary Care

## 2021-09-01 DIAGNOSIS — E1165 Type 2 diabetes mellitus with hyperglycemia: Secondary | ICD-10-CM

## 2021-09-01 MED ORDER — METFORMIN HCL ER 500 MG PO TB24: 1000.0000 mg | ORAL_TABLET | Freq: Two times a day (BID) | ORAL | 1 refills | Status: DC

## 2021-09-01 MED ORDER — BLOOD GLUCOSE MONITOR KIT: PACK | 0 refills | Status: AC

## 2021-10-02 LAB — HM DIABETES EYE EXAM

## 2021-11-30 ENCOUNTER — Ambulatory Visit: Payer: BC Managed Care – PPO | Admitting: Primary Care

## 2021-12-12 ENCOUNTER — Other Ambulatory Visit: Payer: Self-pay | Admitting: Primary Care

## 2021-12-12 DIAGNOSIS — E7800 Pure hypercholesterolemia, unspecified: Secondary | ICD-10-CM

## 2021-12-15 ENCOUNTER — Ambulatory Visit (INDEPENDENT_AMBULATORY_CARE_PROVIDER_SITE_OTHER): Payer: BC Managed Care – PPO | Admitting: Primary Care

## 2021-12-15 ENCOUNTER — Telehealth: Payer: Self-pay | Admitting: Primary Care

## 2021-12-15 ENCOUNTER — Encounter: Payer: Self-pay | Admitting: Primary Care

## 2021-12-15 VITALS — BP 120/68 | HR 66 | Temp 98.0°F | Ht 73.0 in | Wt 198.0 lb

## 2021-12-15 DIAGNOSIS — E1165 Type 2 diabetes mellitus with hyperglycemia: Secondary | ICD-10-CM | POA: Diagnosis not present

## 2021-12-15 LAB — POCT GLYCOSYLATED HEMOGLOBIN (HGB A1C): Hemoglobin A1C: 6.4 % — AB (ref 4.0–5.6)

## 2021-12-15 NOTE — Patient Instructions (Signed)
Continue metformin XR 2 tablets twice daily for diabetes. ? ?Your A1C today was 6.4!!!!!! ? ?Please schedule a follow up visit for 6 months for a diabetes check. ? ?Keep up the great work!!!! ?

## 2021-12-15 NOTE — Telephone Encounter (Signed)
Pt just called and stated this "I am on the side of the highway and have no gas, do not cancel my appointment if you do I will never come back to this office again." I made him aware of the 10 minute grace period, he said he will try to get here on time. Pt was very frustrated and tone of his voice was loud. His appointment is today with Mayra Reel @8 :40 am.  ? ?Callback Number: [phone] ?

## 2021-12-15 NOTE — Progress Notes (Signed)
? ?Subjective:  ? ? Patient ID: Cody Oneill, male    DOB: 06-03-1966, 56 y.o.   MRN: [id number] ? ?HPI ? ?Cody Oneill is a very pleasant 56 y.o. male with a history of type 2 diabetes, hyperlipidemia, erectile dysfunction who presents today for follow-up of diabetes. ? ? ?Current medications include: metformin XR 1000 mg BID ? ?He is checking his blood glucose 1 times daily and is getting readings ranging 140's.  ? ?Last A1C: 9.6 in January 2023, 6.4 today ?Last Eye Exam: UTD ?Last Foot Exam: UTD ?Pneumonia Vaccination: 2017 ?Urine Microalbumin: UTD ?Statin: atorvastatin  ? ?Dietary changes since last visit: He has cut out soda and cut back on sweets. He has cut back on portion sizes.  ? ? ?Exercise: More active at work and home ? ? ? ? ?Review of Systems  ?Eyes:  Negative for visual disturbance.  ?Respiratory:  Negative for shortness of breath.   ?Cardiovascular:  Negative for chest pain.  ?Neurological:  Negative for numbness.  ? ?   ? ? ?Past Medical History:  ?Diagnosis Date  ? Diverticulitis   ? ? ?Social History  ? ?Socioeconomic History  ? Marital status: Married  ?  Spouse name: Not on file  ? Number of children: Not on file  ? Years of education: Not on file  ? Highest education level: Not on file  ?Occupational History  ? Not on file  ?Tobacco Use  ? Smoking status: Never  ? Smokeless tobacco: Never  ?Substance and Sexual Activity  ? Alcohol use: No  ?  Alcohol/week: 0.0 standard drinks  ? Drug use: No  ? Sexual activity: Not on file  ?Other Topics Concern  ? Not on file  ?Social History Narrative  ? Married.  ? 3 children.  ? 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  ? Enjoys playing tennis, going to the [place], snow skiing.   ? ?Social Determinants of Health  ? ?Financial Resource Strain: Not on file  ?Food Insecurity: Not on file  ?Transportation Needs: Not on file  ?Physical Activity: Not on file  ?Stress: Not on file  ?Social Connections: Not on file  ?Intimate Partner Violence: Not on file   ? ? ?Past Surgical History:  ?Procedure Laterality Date  ? COLON SURGERY  2006  ? ? ?History reviewed. No pertinent family history. ? ?No Active Allergies ? ?Current Outpatient Medications on File Prior to Visit  ?Medication Sig Dispense Refill  ? atorvastatin (LIPITOR) 10 MG tablet TAKE 1 TABLET BY MOUTH DAILY FOR CHOLESTEROL. 90 tablet 1  ? blood glucose meter kit and supplies KIT Dispense based on patient and insurance preference. Use up to four times daily as directed. (FOR ICD-9 250.00, 250.01). 1 each 0  ? metFORMIN (GLUCOPHAGE-XR) 500 MG 24 hr tablet Take 2 tablets (1,000 mg total) by mouth 2 (two) times daily. For diabetes. 360 tablet 1  ? sildenafil (REVATIO) 20 MG tablet Take 2-5 tablets by mouth 30-60 minutes prior to intercourse. (Patient not taking: Reported on 12/15/2021) 50 tablet 0  ? ?No current facility-administered medications on file prior to visit.  ? ? ?BP 120/68   Pulse 66   Temp 98 ?F (36.7 ?C) (Oral)   Ht _0  (1.854 m)   Wt 198 lb (89.8 kg)   SpO2 (!) 66%   BMI 26.12 kg/m?  ?Objective:  ? Physical Exam ?Cardiovascular:  ?   Rate and Rhythm: Normal rate and regular rhythm.  ?Pulmonary:  ?   Effort:  Pulmonary effort is normal.  ?   Breath sounds: Normal breath sounds. No wheezing or rales.  ?Musculoskeletal:  ?   Cervical back: Neck supple.  ?Skin: ?   General: Skin is warm and dry.  ?Neurological:  ?   Mental Status: He is alert and oriented to person, place, and time.  ? ? ? ? ? ?   ?Assessment & Plan:  ? ? ? ? ?This visit occurred during the SARS-CoV-2 public health emergency.  Safety protocols were in place, including screening questions prior to the visit, additional usage of staff PPE, and extensive cleaning of exam room while observing appropriate contact time as indicated for disinfecting solutions.  ?

## 2021-12-15 NOTE — Assessment & Plan Note (Signed)
Improved! ? ?Commended him on lifestyle changes, encouraged to continue! ? ?Continue metformin XR 1000 mg BID. ? ?Follow up in 6 months.  ? ?Foot and eye exam UTD. ?Pneumonia vaccine UTD. ?Managed on statin. ?Urine microalbumin UTD ? ?

## 2021-12-15 NOTE — Telephone Encounter (Signed)
Patient arrived outside of his appointment time but was able to be worked back in. Patient was seen in office today.  ?

## 2021-12-30 ENCOUNTER — Other Ambulatory Visit: Payer: Self-pay | Admitting: Primary Care

## 2021-12-30 DIAGNOSIS — E1165 Type 2 diabetes mellitus with hyperglycemia: Secondary | ICD-10-CM

## 2021-12-31 NOTE — Telephone Encounter (Signed)
Is he needing a certain type of strips or lancets? Which type?

## 2022-01-08 NOTE — Telephone Encounter (Signed)
Left message to return call to our office.  

## 2022-01-23 NOTE — Telephone Encounter (Signed)
Called patient states that he does not need kit just needs needle for the prodigy lancet. Nothing else is needed.

## 2022-01-24 MED ORDER — PRODIGY LANCETS 28G MISC: 1 refills | Status: AC

## 2022-01-24 NOTE — Telephone Encounter (Signed)
Noted, refills provided for lancets.

## 2022-02-05 DIAGNOSIS — D1801 Hemangioma of skin and subcutaneous tissue: Secondary | ICD-10-CM | POA: Diagnosis not present

## 2022-02-05 DIAGNOSIS — L821 Other seborrheic keratosis: Secondary | ICD-10-CM | POA: Diagnosis not present

## 2022-02-05 DIAGNOSIS — L814 Other melanin hyperpigmentation: Secondary | ICD-10-CM | POA: Diagnosis not present

## 2022-02-05 DIAGNOSIS — L570 Actinic keratosis: Secondary | ICD-10-CM | POA: Diagnosis not present

## 2022-02-05 DIAGNOSIS — B070 Plantar wart: Secondary | ICD-10-CM | POA: Diagnosis not present

## 2022-02-09 DIAGNOSIS — F4323 Adjustment disorder with mixed anxiety and depressed mood: Secondary | ICD-10-CM | POA: Diagnosis not present

## 2022-03-01 ENCOUNTER — Ambulatory Visit: Payer: BC Managed Care – PPO | Admitting: Primary Care

## 2022-03-16 ENCOUNTER — Other Ambulatory Visit: Payer: Self-pay | Admitting: Primary Care

## 2022-03-16 DIAGNOSIS — E1165 Type 2 diabetes mellitus with hyperglycemia: Secondary | ICD-10-CM

## 2022-04-21 IMAGING — DX DG WRIST COMPLETE 3+V*R*
3 series · 3 of 3 positions shown · non-contrast
Comparison: No prior.

CLINICAL DATA: Acute wrist pain.  No injury.

EXAM:
RIGHT WRIST - COMPLETE 3+ VIEW

[wrist pa]
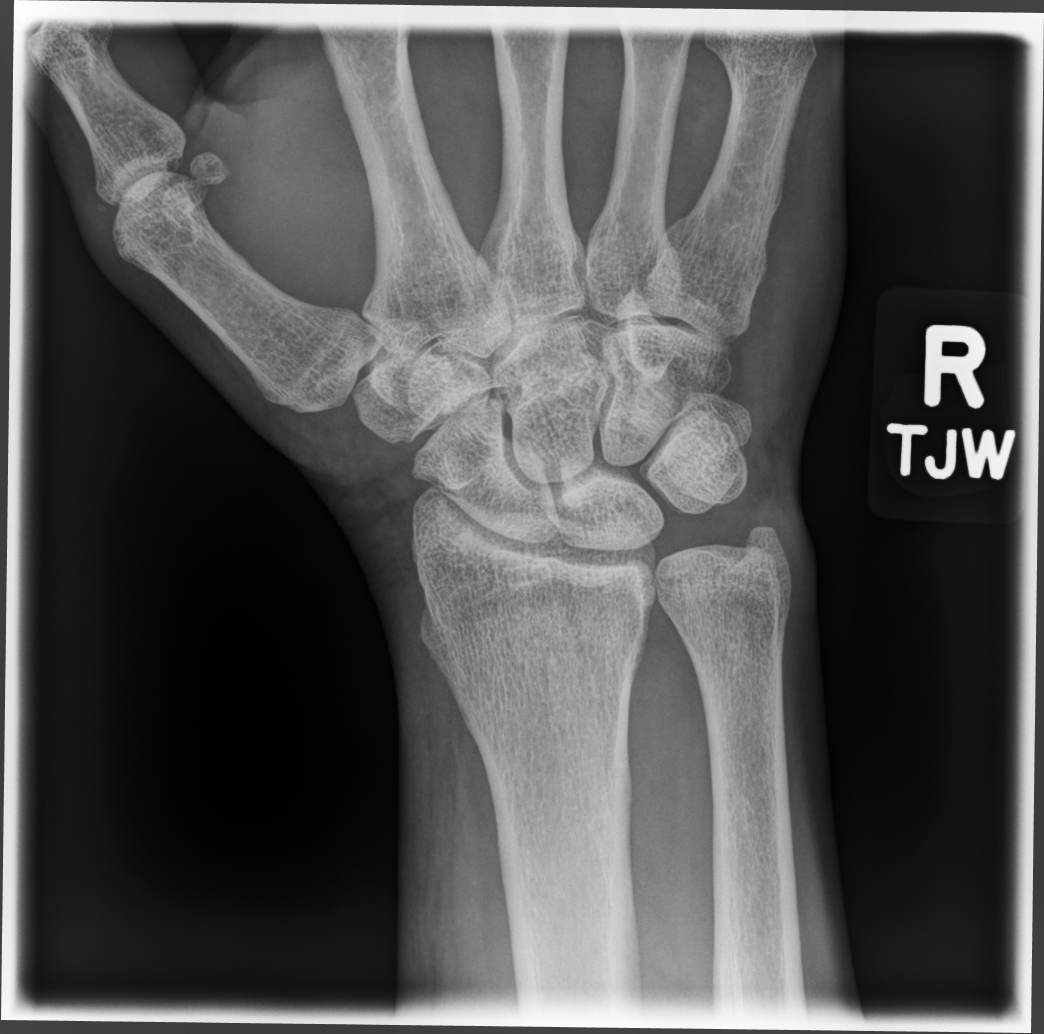

[wrist (navicular) pa]
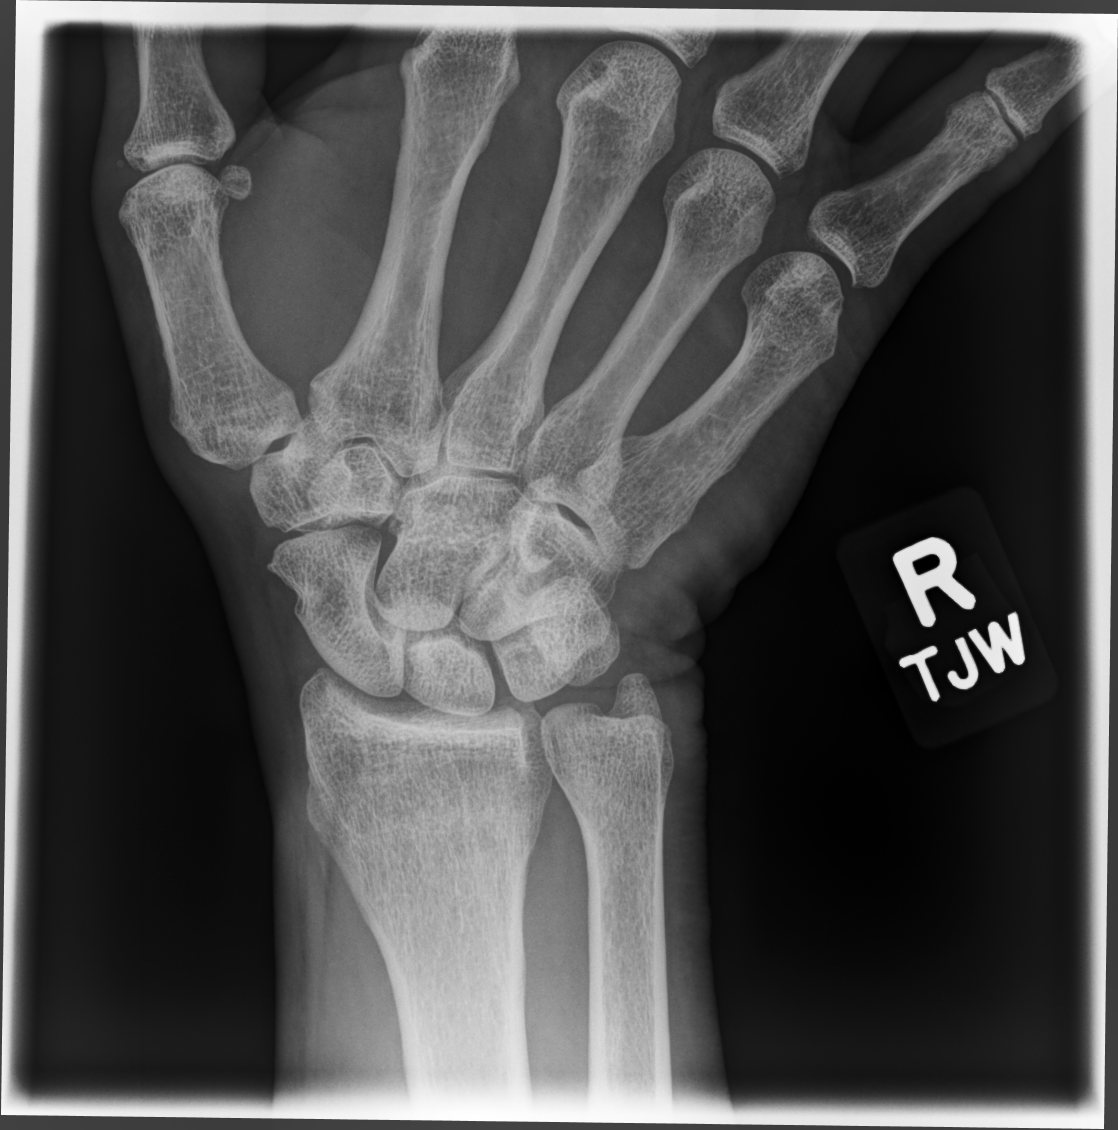

[wrist lat]
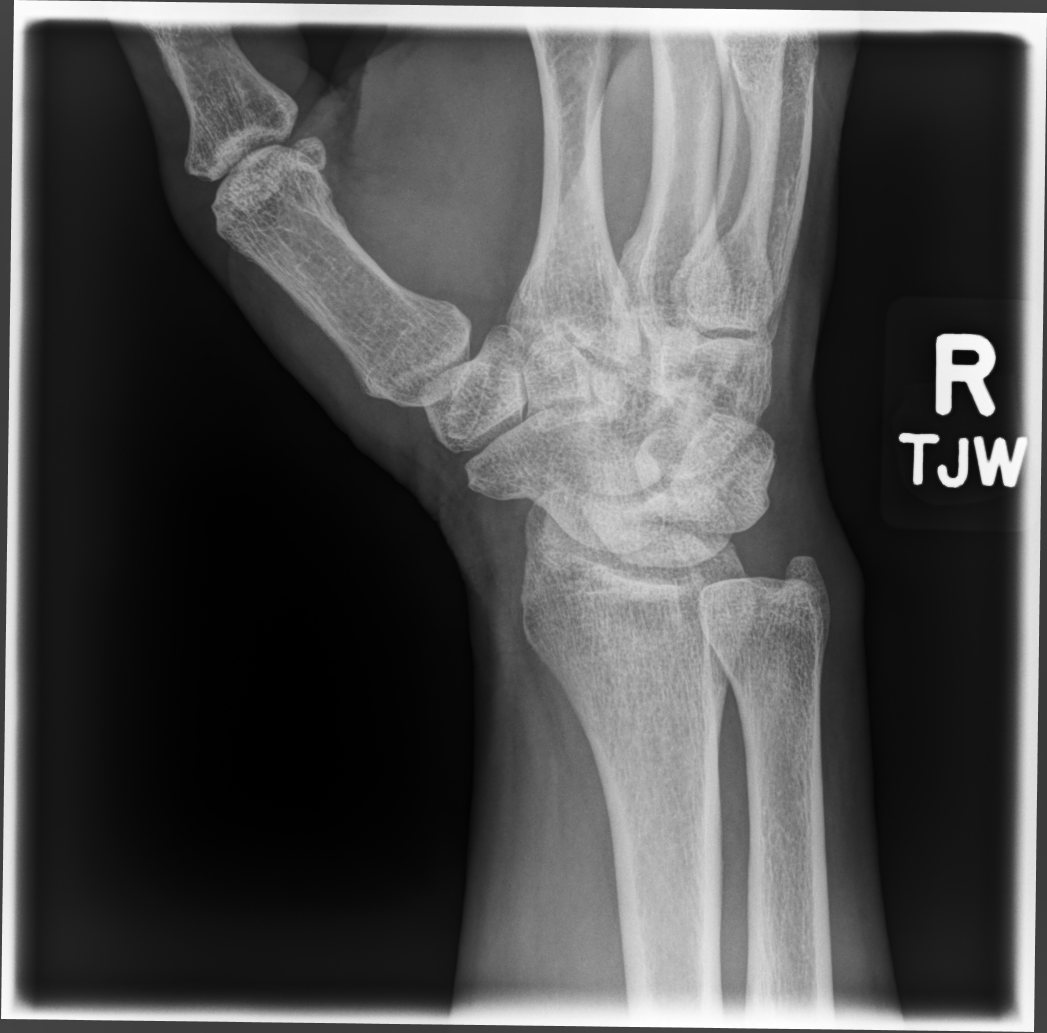

[3 of 3 positions shown; findings below may reference images not displayed]

FINDINGS: Mild radiocarpal degenerative change. No acute bony or joint
abnormality. No evidence of fracture or dislocation. No evidence of
inflammatory arthropathy. Soft tissues are unremarkable.
IMPRESSION: Mild radiocarpal degenerative change.  Exam otherwise unremarkable.

## 2022-06-18 ENCOUNTER — Other Ambulatory Visit: Payer: Self-pay | Admitting: Primary Care

## 2022-06-18 DIAGNOSIS — E7800 Pure hypercholesterolemia, unspecified: Secondary | ICD-10-CM

## 2022-08-24 ENCOUNTER — Ambulatory Visit (INDEPENDENT_AMBULATORY_CARE_PROVIDER_SITE_OTHER): Payer: No Typology Code available for payment source | Admitting: Primary Care

## 2022-08-24 ENCOUNTER — Encounter: Payer: Self-pay | Admitting: Primary Care

## 2022-08-24 ENCOUNTER — Telehealth: Payer: Self-pay

## 2022-08-24 VITALS — BP 122/78 | HR 70 | Temp 97.3°F | Ht 73.0 in | Wt 203.0 lb

## 2022-08-24 DIAGNOSIS — E7800 Pure hypercholesterolemia, unspecified: Secondary | ICD-10-CM | POA: Diagnosis not present

## 2022-08-24 DIAGNOSIS — N529 Male erectile dysfunction, unspecified: Secondary | ICD-10-CM | POA: Diagnosis not present

## 2022-08-24 DIAGNOSIS — E1165 Type 2 diabetes mellitus with hyperglycemia: Secondary | ICD-10-CM | POA: Diagnosis not present

## 2022-08-24 DIAGNOSIS — Z125 Encounter for screening for malignant neoplasm of prostate: Secondary | ICD-10-CM

## 2022-08-24 LAB — COMPREHENSIVE METABOLIC PANEL
ALT: 21 U/L (ref 0–53)
AST: 14 U/L (ref 0–37)
Albumin: 4.4 g/dL (ref 3.5–5.2)
Alkaline Phosphatase: 56 U/L (ref 39–117)
BUN: 13 mg/dL (ref 6–23)
CO2: 30 mEq/L (ref 19–32)
Calcium: 9.5 mg/dL (ref 8.4–10.5)
Chloride: 104 mEq/L (ref 96–112)
Creatinine, Ser: 0.92 mg/dL (ref 0.40–1.50)
GFR: 92.73 mL/min (ref >60.00)
Glucose, Bld: 131 mg/dL — ABNORMAL HIGH (ref 70–99)
Potassium: 5.1 mEq/L (ref 3.5–5.1)
Sodium: 142 mEq/L (ref 135–145)
Total Bilirubin: 0.4 mg/dL (ref 0.2–1.2)
Total Protein: 6.6 g/dL (ref 6.0–8.3)

## 2022-08-24 LAB — PSA: PSA: 0.86 ng/mL (ref 0.10–4.00)

## 2022-08-24 LAB — CBC
HCT: 46.9 % (ref 39.0–52.0)
Hemoglobin: 15.7 g/dL (ref 13.0–17.0)
MCHC: 33.4 g/dL (ref 30.0–36.0)
MCV: 88.5 fl (ref 78.0–100.0)
Platelets: 318 10*3/uL (ref 150.0–400.0)
RBC: 5.3 Mil/uL (ref 4.22–5.81)
RDW: 13.8 % (ref 11.5–15.5)
WBC: 7.5 10*3/uL (ref 4.0–10.5)

## 2022-08-24 LAB — POCT GLYCOSYLATED HEMOGLOBIN (HGB A1C): Hemoglobin A1C: 6.3 % — AB (ref 4.0–5.6)

## 2022-08-24 LAB — LIPID PANEL
Cholesterol: 134 mg/dL (ref 0–200)
HDL: 46.2 mg/dL (ref >39.00)
LDL Cholesterol: 70 mg/dL (ref 0–99)
NonHDL: 87.52
Total CHOL/HDL Ratio: 3
Triglycerides: 90 mg/dL (ref 0.0–149.0)
VLDL: 18 mg/dL (ref 0.0–40.0)

## 2022-08-24 LAB — MICROALBUMIN / CREATININE URINE RATIO
Creatinine,U: 150.3 mg/dL
Microalb Creat Ratio: 0.6 mg/g (ref 0.0–30.0)
Microalb, Ur: 0.9 mg/dL (ref 0.0–1.9)

## 2022-08-24 NOTE — Patient Instructions (Signed)
Stop by the lab prior to leaving today. I will notify you of your results once received.   Please schedule a follow up visit for 6 months for a diabetes check.  It was a pleasure to see you today!   

## 2022-08-24 NOTE — Assessment & Plan Note (Signed)
No concerns today. Remain off sildenafil 20 mg.

## 2022-08-24 NOTE — Telephone Encounter (Signed)
[facility] Night - Client Nonclinical Telephone Record  [facility] Client [facility] Night - Client Client Site [place] - Night Provider Alma Friendly - NP Contact Type Call Who Is Calling Patient / Member / Family / Caregiver Caller Name [name] Caller Phone Number [phone] Patient Name Cody Oneill Patient DOB [birth year] Call Type Message Only Information Provided Reason for Call Request for General Office Information Initial Comment Caller says he will be at his appt shortly. Additional Comment He says he is running a little late but will be there in 10 minutes... due to traffic Disp. Time Disposition Final User 08/24/2022 7:25:10 AM General Information Provided Yes Bettye Boeck Call Closed By: Bettye Boeck Transaction Date/Time: 08/24/2022 7:23:11 AM (ET  Per chart review tab pt has already been seen today. Nothing further needed.

## 2022-08-24 NOTE — Assessment & Plan Note (Signed)
Repeat lipid panel pending. Continue atorvastatin 10 mg daily. 

## 2022-08-24 NOTE — Progress Notes (Signed)
Subjective:    Patient ID: Cody Oneill, male    DOB: 1965/10/30, 57 y.o.   MRN: [id number]  HPI  Cody Oneill is a very pleasant 57 y.o. male  has a past medical history of Acute pain of right wrist (04/18/2021), Diverticulitis, and Injury of face (02/27/2017)., type 2 diabetes, hyperlipidemia who presents today for follow up of chronic health conditions.  1) Type 2 Diabetes:  Current medications include: metformin XR 1000 mg BID  He is checking his blood glucose on occasion and is getting readings of 120's-140's.   Last A1C: 6.4 in May 2023, 6.3 today Last Eye Exam: UTD per patient  Last Foot Exam: Due Pneumonia Vaccination: 2017 Urine Microalbumin: Due Statin: atorvastatin   Dietary changes since last visit: Mostly home cooked meals. Mix of veggies and protein.    Exercise: None.   2) Hyperlipidemia: Currently managed on atorvastatin 10 mg daily. He is due for repeat lipid panel today. He denies chest pain, dizziness, headaches.   3) Erectile Dysfunction: Previously prescribed sildenafil 20 mg, has not taken in >1 year.   BP Readings from Last 3 Encounters:  08/24/22 122/78  12/15/21 120/68  08/31/21 (!) 110/54     Review of Systems  Eyes:  Negative for visual disturbance.  Respiratory:  Negative for shortness of breath.   Cardiovascular:  Negative for chest pain.  Neurological:  Negative for dizziness and headaches.         Past Medical History:  Diagnosis Date   Acute pain of right wrist 04/18/2021   Diverticulitis    Injury of face 02/27/2017    Social History   Socioeconomic History   Marital status: Married    Spouse name: Not on file   Number of children: Not on file   Years of education: Not on file   Highest education level: Not on file  Occupational History   Not on file  Tobacco Use   Smoking status: Never   Smokeless tobacco: Never  Substance and Sexual Activity   Alcohol use: No    Alcohol/week: 0.0 standard drinks of alcohol    Drug use: No   Sexual activity: Not on file  Other Topics Concern   Not on file  Social History Narrative   Married.   3 children.   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   Enjoys playing tennis, going to the [place], snow skiing.    Social Determinants of Health   Financial Resource Strain: Not on file  Food Insecurity: Not on file  Transportation Needs: Not on file  Physical Activity: Not on file  Stress: Not on file  Social Connections: Not on file  Intimate Partner Violence: Not on file    Past Surgical History:  Procedure Laterality Date   COLON SURGERY  2006    History reviewed. No pertinent family history.  No Active Allergies  Current Outpatient Medications on File Prior to Visit  Medication Sig Dispense Refill   atorvastatin (LIPITOR) 10 MG tablet TAKE 1 TABLET BY MOUTH DAILY FOR CHOLESTEROL. 90 tablet 0   blood glucose meter kit and supplies KIT Dispense based on patient and insurance preference. Use up to four times daily as directed. (FOR ICD-9 250.00, 250.01). 1 each 0   metFORMIN (GLUCOPHAGE-XR) 500 MG 24 hr tablet TAKE 2 TABLETS (1,000 MG TOTAL) BY MOUTH 2 (TWO) TIMES DAILY. FOR DIABETES. 360 tablet 1   Prodigy Lancets 28G MISC Use up to four times daily 400 each 1  No current facility-administered medications on file prior to visit.    BP 122/78   Pulse 70   Temp (!) 97.3 F (36.3 C) (Temporal)   Ht 6\' 1"  (1.854 m)   Wt 203 lb (92.1 kg)   SpO2 98%   BMI 26.78 kg/m  Objective:   Physical Exam Cardiovascular:     Rate and Rhythm: Normal rate and regular rhythm.  Pulmonary:     Effort: Pulmonary effort is normal.     Breath sounds: Normal breath sounds. No wheezing or rales.  Musculoskeletal:     Cervical back: Neck supple.  Skin:    General: Skin is warm and dry.  Neurological:     Mental Status: He is alert and oriented to person, place, and time.           Assessment & Plan:  Type 2 diabetes mellitus with hyperglycemia,  without long-term current use of insulin (HCC) Assessment & Plan: Controlled with A1C today of 6.3!  Foot exam today. Urine microalbumin due and pending.  Continue metformin XR 1000 mg BID.  Follow up in 6 months.  Orders: -     POCT glycosylated hemoglobin (Hb A1C)  Hypercholesteremia Assessment & Plan: Repeat lipid panel pending.  Continue atorvastatin 10 mg daily.  Orders: -     Lipid panel -     Comprehensive metabolic panel -     CBC  Screening for prostate cancer -     PSA  Erectile dysfunction, unspecified erectile dysfunction type Assessment & Plan: No concerns today. Remain off sildenafil 20 mg.         Pleas Koch, NP

## 2022-08-24 NOTE — Addendum Note (Signed)
Addended by: Pleas Koch on: 08/24/2022 07:51 AM   Modules accepted: Orders

## 2022-08-24 NOTE — Assessment & Plan Note (Signed)
Controlled with A1C today of 6.3!  Foot exam today. Urine microalbumin due and pending.  Continue metformin XR 1000 mg BID.  Follow up in 6 months.

## 2022-09-25 ENCOUNTER — Other Ambulatory Visit: Payer: Self-pay | Admitting: Primary Care

## 2022-09-25 DIAGNOSIS — E1165 Type 2 diabetes mellitus with hyperglycemia: Secondary | ICD-10-CM

## 2022-09-25 DIAGNOSIS — E7800 Pure hypercholesterolemia, unspecified: Secondary | ICD-10-CM

## 2023-02-22 ENCOUNTER — Ambulatory Visit: Payer: No Typology Code available for payment source | Admitting: Primary Care

## 2023-03-01 ENCOUNTER — Ambulatory Visit (INDEPENDENT_AMBULATORY_CARE_PROVIDER_SITE_OTHER): Payer: No Typology Code available for payment source | Admitting: Primary Care

## 2023-03-01 ENCOUNTER — Encounter: Payer: Self-pay | Admitting: Primary Care

## 2023-03-01 VITALS — BP 122/78 | HR 77 | Temp 97.7°F | Ht 73.0 in | Wt 195.0 lb

## 2023-03-01 DIAGNOSIS — E1165 Type 2 diabetes mellitus with hyperglycemia: Secondary | ICD-10-CM | POA: Diagnosis not present

## 2023-03-01 DIAGNOSIS — Z7984 Long term (current) use of oral hypoglycemic drugs: Secondary | ICD-10-CM

## 2023-03-01 LAB — POCT GLYCOSYLATED HEMOGLOBIN (HGB A1C): Hemoglobin A1C: 6.3 % — AB (ref 4.0–5.6)

## 2023-03-01 MED ORDER — GLUCOSE BLOOD VI STRP: ORAL_STRIP | 3 refills | Status: AC

## 2023-03-01 NOTE — Progress Notes (Signed)
Subjective:    Patient ID: Cody Oneill, male    DOB: 11-25-1965, 57 y.o.   MRN: [id number]  HPI  Cody Oneill is a very pleasant 57 y.o. male with a history of type 2 diabetes, elevated blood pressure reading, hyperlipidemia, erectile dysfunction who presents today for follow-up of diabetes.  Current medications include: Metformin XR 1000 mg BID.  He is checking his blood glucose 1 times weekly and is getting readings of:  AM fasting: mid 100's   Last A1C: 6.3 in January 2024, 6.3 today Last Eye Exam: UTD Last Foot Exam: Up-to-date Pneumonia Vaccination: 2017 Urine Microalbumin: Up-to-date Statin: Atorvastatin  Dietary changes since last visit: Eats home cooked meals and take out. Ice cream often.    Exercise: Walking   BP Readings from Last 3 Encounters:  03/01/23 122/78  08/24/22 122/78  12/15/21 120/68       Review of Systems  Eyes:  Negative for visual disturbance.  Respiratory:  Negative for shortness of breath.   Cardiovascular:  Negative for chest pain.  Gastrointestinal:  Negative for abdominal pain.  Neurological:  Negative for dizziness and numbness.         Past Medical History:  Diagnosis Date   Acute pain of right wrist 04/18/2021   Diverticulitis    Injury of face 02/27/2017    Social History   Socioeconomic History   Marital status: Married    Spouse name: Not on file   Number of children: Not on file   Years of education: Not on file   Highest education level: Not on file  Occupational History   Not on file  Tobacco Use   Smoking status: Never   Smokeless tobacco: Never  Substance and Sexual Activity   Alcohol use: No    Alcohol/week: 0.0 standard drinks of alcohol   Drug use: No   Sexual activity: Not on file  Other Topics Concern   Not on file  Social History Narrative   Married.   3 children.   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   Enjoys playing tennis, going to the lake, snow skiing.    Social  Determinants of Health   Financial Resource Strain: Not on file  Food Insecurity: Not on file  Transportation Needs: Not on file  Physical Activity: Not on file  Stress: Not on file  Social Connections: Not on file  Intimate Partner Violence: Not on file    Past Surgical History:  Procedure Laterality Date   COLON SURGERY  2006    History reviewed. No pertinent family history.  No Active Allergies  Current Outpatient Medications on File Prior to Visit  Medication Sig Dispense Refill   atorvastatin (LIPITOR) 10 MG tablet TAKE 1 TABLET BY MOUTH DAILY FOR CHOLESTEROL. 90 tablet 1   blood glucose meter kit and supplies KIT Dispense based on patient and insurance preference. Use up to four times daily as directed. (FOR ICD-9 250.00, 250.01). 1 each 0   metFORMIN (GLUCOPHAGE-XR) 500 MG 24 hr tablet TAKE 2 TABLETS (1,000 MG TOTAL) BY MOUTH 2 (TWO) TIMES DAILY. FOR DIABETES. 360 tablet 1   Prodigy Lancets 28G MISC Use up to four times daily 400 each 1   No current facility-administered medications on file prior to visit.    BP 122/78   Pulse 77   Temp 97.7 F (36.5 C) (Temporal)   Ht 6\' 1"  (1.854 m)   Wt 195 lb (88.5 kg)   SpO2 98%   BMI 25.73  kg/m  Objective:   Physical Exam Cardiovascular:     Rate and Rhythm: Normal rate and regular rhythm.  Pulmonary:     Effort: Pulmonary effort is normal.     Breath sounds: Normal breath sounds. No wheezing or rales.  Musculoskeletal:     Cervical back: Neck supple.  Skin:    General: Skin is warm and dry.  Neurological:     Mental Status: He is alert and oriented to person, place, and time.           Assessment & Plan:  Type 2 diabetes mellitus with hyperglycemia, without long-term current use of insulin (HCC) Assessment & Plan: Controlled and stable with A1c of 6.3 today.  Continue metformin XR 1000 mg twice daily. Discussed to work on diet. Increase physical activity.  Follow-up in 6 months.  Orders: -     POCT  glycosylated hemoglobin (Hb A1C)        Doreene Nest, NP

## 2023-03-01 NOTE — Patient Instructions (Signed)
Please schedule a physical to meet with me in 6 months.   It was a pleasure to see you today!   

## 2023-03-01 NOTE — Assessment & Plan Note (Signed)
Controlled and stable with A1c of 6.3 today.  Continue metformin XR 1000 mg twice daily. Discussed to work on diet. Increase physical activity.  Follow-up in 6 months.

## 2023-04-19 ENCOUNTER — Other Ambulatory Visit: Payer: Self-pay | Admitting: Primary Care

## 2023-04-19 DIAGNOSIS — E7800 Pure hypercholesterolemia, unspecified: Secondary | ICD-10-CM

## 2023-04-19 DIAGNOSIS — E1165 Type 2 diabetes mellitus with hyperglycemia: Secondary | ICD-10-CM

## 2023-09-04 ENCOUNTER — Encounter: Payer: Self-pay | Admitting: Primary Care

## 2023-09-04 ENCOUNTER — Ambulatory Visit (INDEPENDENT_AMBULATORY_CARE_PROVIDER_SITE_OTHER): Payer: No Typology Code available for payment source | Admitting: Primary Care

## 2023-09-04 VITALS — BP 102/58 | HR 64 | Temp 97.9°F | Ht 73.0 in | Wt 194.0 lb

## 2023-09-04 DIAGNOSIS — Z1211 Encounter for screening for malignant neoplasm of colon: Secondary | ICD-10-CM | POA: Diagnosis not present

## 2023-09-04 DIAGNOSIS — Z0000 Encounter for general adult medical examination without abnormal findings: Secondary | ICD-10-CM | POA: Diagnosis not present

## 2023-09-04 DIAGNOSIS — E7800 Pure hypercholesterolemia, unspecified: Secondary | ICD-10-CM | POA: Diagnosis not present

## 2023-09-04 DIAGNOSIS — Z125 Encounter for screening for malignant neoplasm of prostate: Secondary | ICD-10-CM | POA: Diagnosis not present

## 2023-09-04 DIAGNOSIS — Z7984 Long term (current) use of oral hypoglycemic drugs: Secondary | ICD-10-CM

## 2023-09-04 DIAGNOSIS — E1165 Type 2 diabetes mellitus with hyperglycemia: Secondary | ICD-10-CM

## 2023-09-04 LAB — COMPREHENSIVE METABOLIC PANEL
ALT: 19 U/L (ref 0–53)
AST: 16 U/L (ref 0–37)
Albumin: 4.6 g/dL (ref 3.5–5.2)
Alkaline Phosphatase: 71 U/L (ref 39–117)
BUN: 14 mg/dL (ref 6–23)
CO2: 29 meq/L (ref 19–32)
Calcium: 9.4 mg/dL (ref 8.4–10.5)
Chloride: 103 meq/L (ref 96–112)
Creatinine, Ser: 0.97 mg/dL (ref 0.40–1.50)
GFR: 86.4 mL/min (ref >60.00)
Glucose, Bld: 118 mg/dL — ABNORMAL HIGH (ref 70–99)
Potassium: 4.2 meq/L (ref 3.5–5.1)
Sodium: 139 meq/L (ref 135–145)
Total Bilirubin: 0.6 mg/dL (ref 0.2–1.2)
Total Protein: 6.8 g/dL (ref 6.0–8.3)

## 2023-09-04 LAB — LIPID PANEL
Cholesterol: 153 mg/dL (ref 0–200)
HDL: 51.3 mg/dL (ref >39.00)
LDL Cholesterol: 72 mg/dL (ref 0–99)
NonHDL: 101.61
Total CHOL/HDL Ratio: 3
Triglycerides: 148 mg/dL (ref 0.0–149.0)
VLDL: 29.6 mg/dL (ref 0.0–40.0)

## 2023-09-04 LAB — PSA: PSA: 1.01 ng/mL (ref 0.10–4.00)

## 2023-09-04 LAB — HEMOGLOBIN A1C: Hgb A1c MFr Bld: 6.6 % — ABNORMAL HIGH (ref 4.6–6.5)

## 2023-09-04 NOTE — Assessment & Plan Note (Signed)
Immunizations UTD. Colon cancer screening due, he opts for Cologuard again.  Orders placed. PSA due and pending.  Discussed the importance of a healthy diet and regular exercise in order for weight loss, and to reduce the risk of further co-morbidity.  Exam stable. Labs pending.  Follow up in 1 year for repeat physical.

## 2023-09-04 NOTE — Progress Notes (Signed)
Subjective:    Patient ID: Cody Oneill, male    DOB: 05/10/1966, 58 y.o.   MRN: [id number]  HPI  Cody Oneill is a very pleasant 58 y.o. male who presents today for complete physical and follow up of chronic conditions.  Immunizations: -Tetanus: Completed in 2016 -Influenza: Completed this season  -Shingles: Completed Shingrix series -Pneumonia: Completed pneumovax in 2017  Diet: Fair diet.  Exercise: No regular exercise.  Eye exam: Completes annually  Dental exam: Completed years ago   Colonoscopy: Completed Cologuard in 2022, due now.    PSA: Due  BP Readings from Last 3 Encounters:  09/04/23 (!) 102/58  03/01/23 122/78  08/24/22 122/78     Wt Readings from Last 3 Encounters:  09/04/23 194 lb (88 kg)  03/01/23 195 lb (88.5 kg)  08/24/22 203 lb (92.1 kg)       Review of Systems  Constitutional:  Negative for unexpected weight change.  HENT:  Negative for rhinorrhea.   Respiratory:  Negative for cough and shortness of breath.   Cardiovascular:  Negative for chest pain.  Gastrointestinal:  Negative for constipation and diarrhea.  Genitourinary:  Negative for difficulty urinating.  Musculoskeletal:  Negative for arthralgias and myalgias.  Skin:  Negative for rash.  Allergic/Immunologic: Negative for environmental allergies.  Neurological:  Negative for dizziness, numbness and headaches.  Psychiatric/Behavioral:  The patient is not nervous/anxious.          Past Medical History:  Diagnosis Date   Acute pain of right wrist 04/18/2021   Diverticulitis    Elevated blood pressure reading 03/21/2015   Injury of face 02/27/2017    Social History   Socioeconomic History   Marital status: Married    Spouse name: Not on file   Number of children: Not on file   Years of education: Not on file   Highest education level: Not on file  Occupational History   Not on file  Tobacco Use   Smoking status: Never   Smokeless tobacco: Never  Substance and  Sexual Activity   Alcohol use: No    Alcohol/week: 0.0 standard drinks of alcohol   Drug use: No   Sexual activity: Not on file  Other Topics Concern   Not on file  Social History Narrative   Married.   3 children.   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   Enjoys playing tennis, going to the lake, snow skiing.    Social Drivers of Corporate investment banker Strain: Not on file  Food Insecurity: Not on file  Transportation Needs: Not on file  Physical Activity: Not on file  Stress: Not on file  Social Connections: Not on file  Intimate Partner Violence: Not on file    Past Surgical History:  Procedure Laterality Date   COLON SURGERY  2006    History reviewed. No pertinent family history.  No Active Allergies  Current Outpatient Medications on File Prior to Visit  Medication Sig Dispense Refill   atorvastatin (LIPITOR) 10 MG tablet TAKE 1 TABLET BY MOUTH DAILY FOR CHOLESTEROL. 90 tablet 1   blood glucose meter kit and supplies KIT Dispense based on patient and insurance preference. Use up to four times daily as directed. (FOR ICD-9 250.00, 250.01). 1 each 0   glucose blood test strip Use to test blood sugar three times daily 300 each 3   metFORMIN (GLUCOPHAGE-XR) 500 MG 24 hr tablet TAKE 2 TABLETS BY MOUTH 2 TIMES DAILY FOR DIABETES. 360 tablet 1  Prodigy Lancets 28G MISC Use up to four times daily 400 each 1   No current facility-administered medications on file prior to visit.    BP (!) 102/58   Pulse 64   Temp 97.9 F (36.6 C) (Temporal)   Ht 6\' 1"  (1.854 m)   Wt 194 lb (88 kg)   SpO2 97%   BMI 25.60 kg/m  Objective:   Physical Exam HENT:     Right Ear: Tympanic membrane and ear canal normal.     Left Ear: Tympanic membrane and ear canal normal.  Eyes:     Pupils: Pupils are equal, round, and reactive to light.  Cardiovascular:     Rate and Rhythm: Normal rate and regular rhythm.  Pulmonary:     Effort: Pulmonary effort is normal.     Breath  sounds: Normal breath sounds.  Abdominal:     General: Bowel sounds are normal.     Palpations: Abdomen is soft.     Tenderness: There is no abdominal tenderness.  Musculoskeletal:        General: Normal range of motion.     Cervical back: Neck supple.  Skin:    General: Skin is warm and dry.  Neurological:     Mental Status: He is alert and oriented to person, place, and time.     Cranial Nerves: No cranial nerve deficit.     Deep Tendon Reflexes:     Reflex Scores:      Patellar reflexes are 2+ on the right side and 2+ on the left side. Psychiatric:        Mood and Affect: Mood normal.           Assessment & Plan:  Preventative health care Assessment & Plan: Immunizations UTD. Colon cancer screening due, he opts for Cologuard again.  Orders placed. PSA due and pending.  Discussed the importance of a healthy diet and regular exercise in order for weight loss, and to reduce the risk of further co-morbidity.  Exam stable. Labs pending.  Follow up in 1 year for repeat physical.    Type 2 diabetes mellitus with hyperglycemia, without long-term current use of insulin (HCC) Assessment & Plan: Repeat A1C pending.  Continue metformin ER 1000 mg twice daily. Urine microalbumin due and pending.  Follow-up in 6 months.  Orders: -     Hemoglobin A1c -     Comprehensive metabolic panel  Hypercholesteremia Assessment & Plan: Repeat lipid panel pending.  Continue atorvastatin 10 mg daily. Commended him on weight loss.  Orders: -     Lipid panel  Screening for colon cancer -     Cologuard  Screening for prostate cancer -     PSA        Doreene Nest, NP

## 2023-09-04 NOTE — Assessment & Plan Note (Signed)
Repeat lipid panel pending.  Continue atorvastatin 10 mg daily. Commended him on weight loss.

## 2023-09-04 NOTE — Assessment & Plan Note (Signed)
Repeat A1C pending.  Continue metformin ER 1000 mg twice daily. Urine microalbumin due and pending.  Follow-up in 6 months.

## 2023-09-04 NOTE — Patient Instructions (Signed)
Stop by the lab prior to leaving today. I will notify you of your results once received.   Complete the Cologuard kit for colon cancer screening once received.  Please schedule a follow up visit for 6 months for a diabetes check.  It was a pleasure to see you today!

## 2023-09-22 LAB — COLOGUARD: COLOGUARD: NEGATIVE

## 2023-10-21 ENCOUNTER — Other Ambulatory Visit: Payer: Self-pay | Admitting: Primary Care

## 2023-10-21 DIAGNOSIS — E1165 Type 2 diabetes mellitus with hyperglycemia: Secondary | ICD-10-CM

## 2024-02-22 ENCOUNTER — Other Ambulatory Visit: Payer: Self-pay | Admitting: Primary Care

## 2024-02-22 DIAGNOSIS — E7800 Pure hypercholesterolemia, unspecified: Secondary | ICD-10-CM

## 2024-03-04 ENCOUNTER — Ambulatory Visit: Payer: Self-pay | Admitting: Primary Care

## 2024-03-04 ENCOUNTER — Encounter: Payer: Self-pay | Admitting: Primary Care

## 2024-03-04 ENCOUNTER — Ambulatory Visit (INDEPENDENT_AMBULATORY_CARE_PROVIDER_SITE_OTHER): Payer: No Typology Code available for payment source | Admitting: Primary Care

## 2024-03-04 ENCOUNTER — Other Ambulatory Visit: Payer: Self-pay | Admitting: Primary Care

## 2024-03-04 VITALS — BP 132/80 | HR 70 | Temp 97.3°F | Ht 73.0 in | Wt 194.0 lb

## 2024-03-04 DIAGNOSIS — E1165 Type 2 diabetes mellitus with hyperglycemia: Secondary | ICD-10-CM | POA: Diagnosis not present

## 2024-03-04 DIAGNOSIS — Z7984 Long term (current) use of oral hypoglycemic drugs: Secondary | ICD-10-CM

## 2024-03-04 LAB — POCT GLYCOSYLATED HEMOGLOBIN (HGB A1C): Hemoglobin A1C: 6.4 % — AB (ref 4.0–5.6)

## 2024-03-04 LAB — MICROALBUMIN / CREATININE URINE RATIO
Creatinine,U: 178.5 mg/dL
Microalb Creat Ratio: 98.2 mg/g — ABNORMAL HIGH (ref 0.0–30.0)
Microalb, Ur: 17.5 mg/dL — ABNORMAL HIGH (ref 0.0–1.9)

## 2024-03-04 NOTE — Patient Instructions (Signed)
 Stop by the lab prior to leaving today. I will notify you of your results once received.   Please schedule a physical to meet with me in 6 months.   It was a pleasure to see you today!

## 2024-03-04 NOTE — Assessment & Plan Note (Signed)
 Improved with A1C of 6.4 today.   Continue metformin  ER 1000 mg BID. Work on diet.  Foot exam today. Urine microalbumin due and pending.  Follow up in 6 months

## 2024-03-04 NOTE — Progress Notes (Signed)
 Subjective:    Patient ID: Cody Oneill, male    DOB: 1966-02-28, 58 y.o.   MRN: [id number]  HPI  Cody Oneill is a very pleasant 58 y.o. male with history of type 2 diabetes, hyperlipidemia who presents today for follow-up diabetes.  Current medications include: Metformin  ER 1000 mg twice daily  He is checking his blood glucose on occasion and is getting readings of:  AM fasting 110  Last A1C: 6.6 in January 2025, 6.4 today  Last Eye Exam: Up-to-date Last Foot Exam: Due Pneumonia Vaccination: 2017 Urine Microalbumin: Due Statin: Atorvastatin   Dietary changes since last visit: He has been eating out more fast food. Ice cream nightly.   Exercise: Active    BP Readings from Last 3 Encounters:  03/04/24 132/80  09/04/23 (!) 102/58  03/01/23 122/78      Review of Systems  Respiratory:  Negative for shortness of breath.   Cardiovascular:  Negative for chest pain.  Neurological:  Negative for dizziness and numbness.         Past Medical History:  Diagnosis Date   Acute pain of right wrist 04/18/2021   Diverticulitis    Elevated blood pressure reading 03/21/2015   Injury of face 02/27/2017    Social History   Socioeconomic History   Marital status: Married    Spouse name: Not on file   Number of children: Not on file   Years of education: Not on file   Highest education level: Bachelor's degree (e.g., BA, AB, BS)  Occupational History   Not on file  Tobacco Use   Smoking status: Never   Smokeless tobacco: Never  Substance and Sexual Activity   Alcohol use: No    Alcohol/week: 0.0 standard drinks of alcohol   Drug use: No   Sexual activity: Not on file  Other Topics Concern   Not on file  Social History Narrative   Married.   3 children.   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   Enjoys playing tennis, going to the lake, snow skiing.    Social Drivers of Corporate investment banker Strain: Low Risk  (03/03/2024)   Overall Financial  Resource Strain (CARDIA)    Difficulty of Paying Living Expenses: Not hard at all  Food Insecurity: No Food Insecurity (03/03/2024)   Hunger Vital Sign    Worried About Running Out of Food in the Last Year: Never true    Ran Out of Food in the Last Year: Never true  Transportation Needs: No Transportation Needs (03/03/2024)   PRAPARE - Administrator, Civil Service (Medical): No    Lack of Transportation (Non-Medical): No  Physical Activity: Sufficiently Active (03/03/2024)   Exercise Vital Sign    Days of Exercise per Week: 5 days    Minutes of Exercise per Session: 30 min  Stress: No Stress Concern Present (03/03/2024)   [employer] of Occupational Health - Occupational Stress Questionnaire    Feeling of Stress: Not at all  Social Connections: Moderately Isolated (03/03/2024)   Social Connection and Isolation Panel    Frequency of Communication with Friends and Family: Twice a week    Frequency of Social Gatherings with Friends and Family: Twice a week    Attends Religious Services: 1 to 4 times per year    Active Member of [employer] or Organizations: No    Attends Engineer, structural: Not on file    Marital Status: Divorced  Intimate Partner Violence: Not on  file    Past Surgical History:  Procedure Laterality Date   COLON SURGERY  2006    History reviewed. No pertinent family history.  No Active Allergies  Current Outpatient Medications on File Prior to Visit  Medication Sig Dispense Refill   atorvastatin  (LIPITOR) 10 MG tablet TAKE 1 TABLET BY MOUTH DAILY FOR CHOLESTEROL. 90 tablet 1   blood glucose meter kit and supplies KIT Dispense based on patient and insurance preference. Use up to four times daily as directed. (FOR ICD-9 250.00, 250.01). 1 each 0   glucose blood test strip Use to test blood sugar three times daily 300 each 3   metFORMIN  (GLUCOPHAGE -XR) 500 MG 24 hr tablet TAKE 2 TABLETS BY MOUTH 2 TIMES DAILY FOR DIABETES. 360 tablet 1   Prodigy  Lancets 28G MISC Use up to four times daily 400 each 1   No current facility-administered medications on file prior to visit.    BP 132/80   Pulse 70   Temp (!) 97.3 F (36.3 C) (Temporal)   Ht 6' 1 (1.854 m)   Wt 194 lb (88 kg)   SpO2 97%   BMI 25.60 kg/m  Objective:   Physical Exam Cardiovascular:     Rate and Rhythm: Normal rate and regular rhythm.  Pulmonary:     Effort: Pulmonary effort is normal.     Breath sounds: Normal breath sounds.  Musculoskeletal:     Cervical back: Neck supple.  Skin:    General: Skin is warm and dry.  Neurological:     Mental Status: He is alert and oriented to person, place, and time.  Psychiatric:        Mood and Affect: Mood normal.           Assessment & Plan:  Type 2 diabetes mellitus with hyperglycemia, without long-term current use of insulin (HCC) Assessment & Plan: Improved with A1C of 6.4 today.   Continue metformin  ER 1000 mg BID. Work on diet.  Foot exam today. Urine microalbumin due and pending.  Follow up in 6 months  Orders: -     POCT glycosylated hemoglobin (Hb A1C) -     Microalbumin / creatinine urine ratio        Comer MARLA Gaskins, NP

## 2024-06-08 ENCOUNTER — Other Ambulatory Visit: Payer: Self-pay | Admitting: Primary Care

## 2024-06-08 DIAGNOSIS — E1165 Type 2 diabetes mellitus with hyperglycemia: Secondary | ICD-10-CM

## 2024-08-12 LAB — OPHTHALMOLOGY REPORT-SCANNED

## 2024-09-02 ENCOUNTER — Encounter: Payer: Self-pay | Admitting: Primary Care
